# Patient Record
Sex: Female | Born: 1975 | Race: Black or African American | Hispanic: No | Marital: Single | Smoking: Never smoker
Health system: Southern US, Community
[De-identification: ages and names within clinical notes are randomized; demographics above are authoritative.]

## PROBLEM LIST (undated history)

## (undated) DIAGNOSIS — E282 Polycystic ovarian syndrome: Secondary | ICD-10-CM

## (undated) DIAGNOSIS — F419 Anxiety disorder, unspecified: Secondary | ICD-10-CM

## (undated) DIAGNOSIS — E559 Vitamin D deficiency, unspecified: Secondary | ICD-10-CM

## (undated) DIAGNOSIS — I10 Essential (primary) hypertension: Secondary | ICD-10-CM

## (undated) DIAGNOSIS — R0600 Dyspnea, unspecified: Secondary | ICD-10-CM

## (undated) DIAGNOSIS — R5382 Chronic fatigue, unspecified: Secondary | ICD-10-CM

## (undated) DIAGNOSIS — K219 Gastro-esophageal reflux disease without esophagitis: Secondary | ICD-10-CM

## (undated) DIAGNOSIS — E785 Hyperlipidemia, unspecified: Secondary | ICD-10-CM

## (undated) DIAGNOSIS — N926 Irregular menstruation, unspecified: Secondary | ICD-10-CM

## (undated) DIAGNOSIS — F32A Depression, unspecified: Secondary | ICD-10-CM

## (undated) DIAGNOSIS — M2550 Pain in unspecified joint: Secondary | ICD-10-CM

## (undated) DIAGNOSIS — G4730 Sleep apnea, unspecified: Secondary | ICD-10-CM

## (undated) DIAGNOSIS — L709 Acne, unspecified: Secondary | ICD-10-CM

## (undated) DIAGNOSIS — E739 Lactose intolerance, unspecified: Secondary | ICD-10-CM

## (undated) DIAGNOSIS — R600 Localized edema: Secondary | ICD-10-CM

## (undated) DIAGNOSIS — G9332 Myalgic encephalomyelitis/chronic fatigue syndrome: Secondary | ICD-10-CM

## (undated) DIAGNOSIS — F329 Major depressive disorder, single episode, unspecified: Secondary | ICD-10-CM

## (undated) DIAGNOSIS — N979 Female infertility, unspecified: Secondary | ICD-10-CM

## (undated) DIAGNOSIS — K5900 Constipation, unspecified: Secondary | ICD-10-CM

## (undated) DIAGNOSIS — M549 Dorsalgia, unspecified: Secondary | ICD-10-CM

## (undated) DIAGNOSIS — E669 Obesity, unspecified: Secondary | ICD-10-CM

## (undated) HISTORY — DX: Myalgic encephalomyelitis/chronic fatigue syndrome: G93.32

## (undated) HISTORY — DX: Obesity, unspecified: E66.9

## (undated) HISTORY — DX: Constipation, unspecified: K59.00

## (undated) HISTORY — DX: Female infertility, unspecified: N97.9

## (undated) HISTORY — DX: Chronic fatigue, unspecified: R53.82

## (undated) HISTORY — DX: Dyspnea, unspecified: R06.00

## (undated) HISTORY — DX: Irregular menstruation, unspecified: N92.6

## (undated) HISTORY — DX: Vitamin D deficiency, unspecified: E55.9

## (undated) HISTORY — DX: Depression, unspecified: F32.A

## (undated) HISTORY — DX: Dorsalgia, unspecified: M54.9

## (undated) HISTORY — DX: Polycystic ovarian syndrome: E28.2

## (undated) HISTORY — PX: EYE SURGERY: SHX253

## (undated) HISTORY — DX: Gastro-esophageal reflux disease without esophagitis: K21.9

## (undated) HISTORY — DX: Localized edema: R60.0

## (undated) HISTORY — PX: BLADDER SURGERY: SHX569

## (undated) HISTORY — DX: Essential (primary) hypertension: I10

## (undated) HISTORY — DX: Acne, unspecified: L70.9

## (undated) HISTORY — DX: Pain in unspecified joint: M25.50

## (undated) HISTORY — DX: Hyperlipidemia, unspecified: E78.5

## (undated) HISTORY — DX: Lactose intolerance, unspecified: E73.9

## (undated) HISTORY — DX: Sleep apnea, unspecified: G47.30

## (undated) HISTORY — DX: Anxiety disorder, unspecified: F41.9

---
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
## 1998-01-22 ENCOUNTER — Encounter: Admission: RE | Admit: 1998-01-22 | Discharge: 1998-04-22 | Payer: Self-pay | Admitting: *Deleted

## 1998-02-17 ENCOUNTER — Encounter: Payer: Self-pay | Admitting: Emergency Medicine

## 1998-02-17 ENCOUNTER — Emergency Department (HOSPITAL_COMMUNITY): Admission: EM | Admit: 1998-02-17 | Discharge: 1998-02-17 | Payer: Self-pay | Admitting: Emergency Medicine

## 1998-12-09 ENCOUNTER — Emergency Department (HOSPITAL_COMMUNITY): Admission: EM | Admit: 1998-12-09 | Discharge: 1998-12-09 | Payer: Self-pay | Admitting: Emergency Medicine

## 1999-05-14 ENCOUNTER — Other Ambulatory Visit: Admission: RE | Admit: 1999-05-14 | Discharge: 1999-05-14 | Payer: Self-pay | Admitting: Gynecology

## 2000-04-20 ENCOUNTER — Encounter (INDEPENDENT_AMBULATORY_CARE_PROVIDER_SITE_OTHER): Payer: Self-pay | Admitting: *Deleted

## 2000-04-20 ENCOUNTER — Ambulatory Visit (HOSPITAL_BASED_OUTPATIENT_CLINIC_OR_DEPARTMENT_OTHER): Admission: RE | Admit: 2000-04-20 | Discharge: 2000-04-20 | Payer: Self-pay | Admitting: Urology

## 2000-06-03 ENCOUNTER — Other Ambulatory Visit: Admission: RE | Admit: 2000-06-03 | Discharge: 2000-06-03 | Payer: Self-pay | Admitting: Gynecology

## 2002-10-10 ENCOUNTER — Other Ambulatory Visit: Admission: RE | Admit: 2002-10-10 | Discharge: 2002-10-10 | Payer: Self-pay | Admitting: Gynecology

## 2003-09-16 ENCOUNTER — Emergency Department (HOSPITAL_COMMUNITY): Admission: EM | Admit: 2003-09-16 | Discharge: 2003-09-16 | Payer: Self-pay | Admitting: *Deleted

## 2003-10-12 ENCOUNTER — Other Ambulatory Visit: Admission: RE | Admit: 2003-10-12 | Discharge: 2003-10-12 | Payer: Self-pay | Admitting: Gynecology

## 2004-12-05 ENCOUNTER — Emergency Department (HOSPITAL_COMMUNITY): Admission: EM | Admit: 2004-12-05 | Discharge: 2004-12-05 | Payer: Self-pay | Admitting: Family Medicine

## 2005-01-31 ENCOUNTER — Emergency Department (HOSPITAL_COMMUNITY): Admission: EM | Admit: 2005-01-31 | Discharge: 2005-01-31 | Payer: Self-pay | Admitting: Emergency Medicine

## 2005-08-14 ENCOUNTER — Other Ambulatory Visit: Admission: RE | Admit: 2005-08-14 | Discharge: 2005-08-14 | Payer: Self-pay | Admitting: Gynecology

## 2007-01-11 ENCOUNTER — Emergency Department (HOSPITAL_COMMUNITY): Admission: EM | Admit: 2007-01-11 | Discharge: 2007-01-11 | Payer: Self-pay | Admitting: Emergency Medicine

## 2007-05-17 ENCOUNTER — Other Ambulatory Visit: Admission: RE | Admit: 2007-05-17 | Discharge: 2007-05-17 | Payer: Self-pay | Admitting: Gynecology

## 2007-11-21 ENCOUNTER — Emergency Department (HOSPITAL_COMMUNITY): Admission: EM | Admit: 2007-11-21 | Discharge: 2007-11-21 | Payer: Self-pay | Admitting: *Deleted

## 2007-12-18 ENCOUNTER — Emergency Department (HOSPITAL_BASED_OUTPATIENT_CLINIC_OR_DEPARTMENT_OTHER): Admission: EM | Admit: 2007-12-18 | Discharge: 2007-12-19 | Payer: Self-pay | Admitting: Emergency Medicine

## 2009-04-20 ENCOUNTER — Emergency Department (HOSPITAL_BASED_OUTPATIENT_CLINIC_OR_DEPARTMENT_OTHER): Admission: EM | Admit: 2009-04-20 | Discharge: 2009-04-20 | Payer: Self-pay | Admitting: Emergency Medicine

## 2009-05-03 ENCOUNTER — Ambulatory Visit (HOSPITAL_COMMUNITY): Admission: RE | Admit: 2009-05-03 | Discharge: 2009-05-03 | Payer: Self-pay | Admitting: Internal Medicine

## 2009-07-16 ENCOUNTER — Ambulatory Visit: Payer: Self-pay | Admitting: Internal Medicine

## 2009-07-23 LAB — CBC WITH DIFFERENTIAL/PLATELET
Basophils Absolute: 0 10*3/uL (ref 0.0–0.1)
EOS%: 2.7 % (ref 0.0–7.0)
Eosinophils Absolute: 0.2 10*3/uL (ref 0.0–0.5)
HCT: 36.9 % (ref 34.8–46.6)
HGB: 12.6 g/dL (ref 11.6–15.9)
LYMPH%: 27.5 % (ref 14.0–49.7)
MCH: 32.1 pg (ref 25.1–34.0)
MCHC: 34.1 g/dL (ref 31.5–36.0)
MCV: 94.3 fL (ref 79.5–101.0)
MONO#: 0.5 10*3/uL (ref 0.1–0.9)
MONO%: 6.9 % (ref 0.0–14.0)
NEUT%: 62.4 % (ref 38.4–76.8)
Platelets: 284 10*3/uL (ref 145–400)
lymph#: 1.9 10*3/uL (ref 0.9–3.3)

## 2009-07-29 LAB — COMPREHENSIVE METABOLIC PANEL
ALT: 13 U/L (ref 0–35)
AST: 17 U/L (ref 0–37)
Albumin: 4.2 g/dL (ref 3.5–5.2)
CO2: 24 mEq/L (ref 19–32)
Calcium: 9.2 mg/dL (ref 8.4–10.5)
Chloride: 102 mEq/L (ref 96–112)
Total Protein: 7.3 g/dL (ref 6.0–8.3)

## 2009-07-29 LAB — HYPERCOAGULABLE PANEL, COMPREHENSIVE
AntiThromb III Func: 104 % (ref 76–126)
Anticardiolipin IgA: 2 APL U/mL (ref <22)
Anticardiolipin IgG: 3 GPL U/mL (ref <23)
Beta-2 Glyco I IgG: 0 G Units (ref <20)
Beta-2-Glycoprotein I IgA: 3 A Units (ref <20)
Lupus Anticoagulant: NOT DETECTED

## 2010-07-11 NOTE — Op Note (Signed)
[facility]  Patient:    Tina Hartman, Tina Hartman                           MRN: [id number] Proc. Date: 04/20/00 Adm. Date:  [id number] Attending:  Nelma Rothman Iii                           Operative Report  PREOPERATIVE DIAGNOSIS:  Periurethral lesions x 2.  POSTOPERATIVE DIAGNOSIS:  Periurethral lesions x 2.  OPERATION PERFORMED:  Excision of periurethral lesions.  SURGEON:  Lucrezia Starch. Ovidio Hanger, M.D.  ANESTHESIA:  General laryngeal airway.  ESTIMATED BLOOD LOSS:  5 cc.  DRAINS:  16 French Foley catheter.  COMPLICATIONS:  None.  INDICATIONS FOR PROCEDURE:  The patient is a lovely 35 year old black female who was seen by Dimas Aguas C. Mezer, M.D. and felt to have a periurethral mass. She was seen by office and underwent cystourethroscopy and was noted to have two lesions laterally on the right and left side from the urethral meatus which were polypoid lesions and certainly needed excision for biopsy purposes. She understands risks, benefits and alternatives and has elected to proceed.  DESCRIPTION OF PROCEDURE:  The patient was placed in supine position.  After proper general laryngeal airway anesthesia, she was prepped and draped with Betadine in sterile fashion and placed in dorsal lithotomy position.  A speculum was placed and a 16 French Foley catheter was placed into the bladder and the bladder was drained.  Two polypoid lesions were noted to be from the urethral meatus, one at 3 oclock and one at 9 oclock.  The 3 oclock  was slightly larger and was approximately 0.5 cm in diameter.  The base of each was injected with approximately 1 cc of 1% Xylocaine with epinephrine solution with a 25 gauge needle and each was excised with tenotomy scissors.  The mucosa was then run on each side with a running 4-0 chromic catgut locked stitch to facilitate hemostatic coaptation.  Again, hemostasis was noted to be present.  The Foley catheter was maintained in  place and the patient was taken to the recovery room stable.  The specimens were submitted to pathology. DD:  04/20/00 TD:  04/20/00 Job: [id number]

## 2010-11-25 LAB — CBC
Hemoglobin: 14
MCHC: 33.9
MCV: 93.9
Platelets: 290
RDW: 13.7

## 2010-11-25 LAB — BASIC METABOLIC PANEL
BUN: 12
Creatinine, Ser: 0.7
GFR calc Af Amer: >60

## 2010-11-25 LAB — URINALYSIS, ROUTINE W REFLEX MICROSCOPIC
Bilirubin Urine: NEGATIVE
Glucose, UA: NEGATIVE
Ketones, ur: NEGATIVE
Nitrite: NEGATIVE
Protein, ur: NEGATIVE
Specific Gravity, Urine: 1.021

## 2010-11-25 LAB — URINE CULTURE: Colony Count: NO GROWTH

## 2010-11-25 LAB — GLUCOSE, CAPILLARY: Glucose-Capillary: 132 — ABNORMAL HIGH

## 2010-11-25 LAB — PROTIME-INR
INR: 1
Prothrombin Time: 13.4

## 2010-11-25 LAB — DIFFERENTIAL
Lymphocytes Relative: 35
Lymphs Abs: 2.2
Neutro Abs: 3.4

## 2010-11-25 LAB — PREGNANCY, URINE: Preg Test, Ur: NEGATIVE

## 2010-11-25 LAB — APTT: aPTT: 30

## 2015-04-12 ENCOUNTER — Emergency Department (HOSPITAL_COMMUNITY)
Admission: EM | Admit: 2015-04-12 | Discharge: 2015-04-13 | Disposition: A | Discharge status: Home / Self Care | Payer: Commercial Managed Care - PPO | Attending: Emergency Medicine | Admitting: Emergency Medicine

## 2015-04-12 ENCOUNTER — Encounter (HOSPITAL_COMMUNITY): Payer: Self-pay | Admitting: Emergency Medicine

## 2015-04-12 DIAGNOSIS — M25571 Pain in right ankle and joints of right foot: Secondary | ICD-10-CM

## 2015-04-12 DIAGNOSIS — Z79899 Other long term (current) drug therapy: Secondary | ICD-10-CM | POA: Diagnosis not present

## 2015-04-12 DIAGNOSIS — R2241 Localized swelling, mass and lump, right lower limb: Secondary | ICD-10-CM | POA: Diagnosis not present

## 2015-04-12 DIAGNOSIS — Z3202 Encounter for pregnancy test, result negative: Secondary | ICD-10-CM | POA: Insufficient documentation

## 2015-04-12 DIAGNOSIS — Z91040 Latex allergy status: Secondary | ICD-10-CM | POA: Insufficient documentation

## 2015-04-12 DIAGNOSIS — M25471 Effusion, right ankle: Secondary | ICD-10-CM

## 2015-04-12 NOTE — ED Notes (Signed)
Pt states that she started having R calf swelling and pain today. States she travels in the car a lot for work. Denies trauma. Alert and oriented. Denies SOB.

## 2015-04-13 ENCOUNTER — Emergency Department (HOSPITAL_COMMUNITY): Payer: Commercial Managed Care - PPO

## 2015-04-13 LAB — I-STAT BETA HCG BLOOD, ED (MC, WL, AP ONLY): I-stat hCG, quantitative: <5 m[IU]/mL (ref <5)

## 2015-04-13 LAB — D-DIMER, QUANTITATIVE: D-Dimer, Quant: <0.27 ug/mL-FEU (ref 0.00–0.50)

## 2015-04-13 MED ORDER — IBUPROFEN 800 MG PO TABS: 800.0000 mg | ORAL_TABLET | Freq: Three times a day (TID) | ORAL | Status: DC

## 2015-04-13 NOTE — ED Notes (Signed)
Pt states she was taking a vitamin supplement to help with mood and tiredness and she nows remembers that 2 or 3 years ago when she took something she had same feeling but it was because her Vitamin D was low

## 2015-04-13 NOTE — ED Provider Notes (Signed)
CSN: [id number]     Arrival date & time 04/12/15  2226 History   First MD Initiated Contact with Patient 04/13/15 0010     Chief Complaint  Patient presents with  . Leg Pain     (Consider location/radiation/quality/duration/timing/severity/associated sxs/prior Treatment) HPI   Patient is a 40 year old female, otherwise healthy, she presents to emergency room with right medial ankle swelling and discomfort for one week which has gradually been improving with compression stockings and elevation. She states that she first noticed it a week ago when she was driving between patient's, she works as a Water engineer. Her pain is described as a throbbing and soreness that radiates halfway up her medial calf and a few times has radiated up to the back of her right knee with a sensation of tightness in her hamstrings.  She denies any injury to the ankle. She does not have a history of osteoarthritis or any inflammatory joint disease.  She has been able to walk and bear weight without much discomfort.  She began wearing compression stockings approximately 5 days ago, and she has been elevating her legs at night with significant improvement of her swelling. She states that there was some redness when it first began and that has also resolved.  She states that her arrival time at most is 50 minutes. Approximately 2 weeks ago she drove to [place] but she frequently got in and out of the car and walked her dog. She denies any history of DVT/PE. She does not smoke cigarettes, does not take any oral birth control or hormone replacement.  She denies personal CA hx.  No SOP, CP.   No past medical history on file. Past Surgical History  Procedure Laterality Date  . Bladder surgery      polyp removal  . Eye surgery Right    No family history on file. Social History  Substance Use Topics  . Smoking status: Never Smoker   . Smokeless tobacco: Not on file  . Alcohol Use: Yes   OB History    No data  available     Review of Systems  All other systems reviewed and are negative.     Allergies  Latex  Home Medications   Prior to Admission medications   Medication Sig Start Date End Date Taking? Authorizing Provider  Calcium Citrate-Vitamin D (CALCIUM + D PO) Take 1 tablet by mouth daily.   Yes Historical Provider, MD  OVER THE COUNTER MEDICATION Take 1 tablet by mouth daily. Sunny mood vitamin   Yes Historical Provider, MD  spironolactone (ALDACTONE) 100 MG tablet Take 100 mg by mouth daily.   Yes Historical Provider, MD  ibuprofen (ADVIL,MOTRIN) 800 MG tablet Take 1 tablet (800 mg total) by mouth 3 (three) times daily. 04/13/15   Danelle Berry, PA-C   BP 145/104 mmHg  Pulse 76  Temp(Src) 97.8 F (36.6 C) (Oral)  Resp 18  SpO2 100%  LMP 04/01/2015 (Exact Date) Physical Exam  Constitutional: She is oriented to person, place, and time. She appears well-developed and well-nourished. No distress.  HENT:  Head: Normocephalic and atraumatic.  Right Ear: External ear normal.  Left Ear: External ear normal.  Nose: Nose normal.  Mouth/Throat: Oropharynx is clear and moist. No oropharyngeal exudate.  Eyes: Conjunctivae and EOM are normal. Pupils are equal, round, and reactive to light. Right eye exhibits no discharge. Left eye exhibits no discharge. No scleral icterus.  Neck: Normal range of motion. Neck supple. No JVD present. No  tracheal deviation present.  Cardiovascular: Normal rate, regular rhythm and normal heart sounds.  Exam reveals no gallop and no friction rub.   No murmur heard. Pulmonary/Chest: Effort normal and breath sounds normal. No stridor. No respiratory distress. She has no wheezes. She has no rales. She exhibits no tenderness.  Abdominal: Soft. Bowel sounds are normal.  Musculoskeletal: Normal range of motion. She exhibits edema and tenderness.       Right ankle: She exhibits swelling. She exhibits normal range of motion, no ecchymosis, no deformity, no laceration  and normal pulse. No lateral malleolus and no medial malleolus tenderness found. Achilles tendon normal.       Feet:  Lymphadenopathy:    She has no cervical adenopathy.  Neurological: She is alert and oriented to person, place, and time. She exhibits normal muscle tone. Coordination normal.  Skin: Skin is warm and dry. No rash noted. She is not diaphoretic. No erythema. No pallor.  Psychiatric: She has a normal mood and affect. Her behavior is normal. Judgment and thought content normal.  Nursing note and vitals reviewed.   ED Course  Procedures (including critical care time) Labs Review Labs Reviewed  D-DIMER, QUANTITATIVE (NOT AT [facility])  I-STAT BETA HCG BLOOD, ED ([facility], [facility], [facility] ONLY)    Imaging Review Dg Ankle Complete Right  04/13/2015  CLINICAL DATA:  Medial ankle swelling for 1 week, no injury or pain. EXAM: RIGHT ANKLE - COMPLETE 3+ VIEW COMPARISON:  RIGHT ankle radiograph December 05, 2004 FINDINGS: No fracture deformity nor dislocation. The ankle mortise appears congruent and the tibiofibular syndesmosis intact. No destructive bony lesions. Small plantar calcaneal spur. Medial ankle soft tissue swelling without subcutaneous gas or radiopaque foreign bodies. IMPRESSION: Medial ankle soft tissue swelling without acute osseous process. Electronically Signed   By: Awilda Metro M.D.   On: 04/13/2015 01:15   I have personally reviewed and evaluated these images and lab results as part of my medical decision-making.   EKG Interpretation None      MDM   Pt with right medial ankle pain and swelling, no known injury, calves symmetrical, Wells criteria for DVT - 0  D-dimer negative.  Right ankle films pertinent for significant for medial ankle soft tissue swelling without acute osseous process.  The patient was offered an ankle brace but she declined.  She was ambulatory without difficulty and did not need any crutches.  She was discharged in good condition with return precautions  reviewed.  No soft tissue swelling or be treated conservatively with NSAIDS, compression, elevation and ice. She will follow up with her PCP as needed.   Final diagnoses:  Pain and swelling of ankle, right       Danelle Berry, PA-C 04/13/15 0540  April Palumbo, MD 04/13/15 [phone]

## 2015-04-13 NOTE — ED Notes (Signed)
Pt ask that her Vitamin D be checked as well.

## 2015-04-13 NOTE — Discharge Instructions (Signed)
Ankle Pain °Ankle pain is a common symptom. The bones, cartilage, tendons, and muscles of the ankle joint perform a lot of work each day. The ankle joint holds your body weight and allows you to move around. Ankle pain can occur on either side or back of 1 or both ankles. Ankle pain may be sharp and burning or dull and aching. There may be tenderness, stiffness, redness, or warmth around the ankle. The pain occurs more often when a person walks or puts pressure on the ankle. °CAUSES  °There are many reasons ankle pain can develop. It is important to work with your caregiver to identify the cause since many conditions can impact the bones, cartilage, muscles, and tendons. Causes for ankle pain include: °· Injury, including a break (fracture), sprain, or strain often due to a fall, sports, or a high-impact activity. °· Swelling (inflammation) of a tendon (tendonitis). °· Achilles tendon rupture. °· Ankle instability after repeated sprains and strains. °· Poor foot alignment. °· Pressure on a nerve (tarsal tunnel syndrome). °· Arthritis in the ankle or the lining of the ankle. °· Crystal formation in the ankle (gout or pseudogout). °DIAGNOSIS  °A diagnosis is based on your medical history, your symptoms, results of your physical exam, and results of diagnostic tests. Diagnostic tests may include X-ray exams or a computerized magnetic scan (magnetic resonance imaging, MRI). °TREATMENT  °Treatment will depend on the cause of your ankle pain and may include: °· Keeping pressure off the ankle and limiting activities. °· Using crutches or other walking support (a cane or brace). °· Using rest, ice, compression, and elevation. °· Participating in physical therapy or home exercises. °· Wearing shoe inserts or special shoes. °· Losing weight. °· Taking medications to reduce pain or swelling or receiving an injection. °· Undergoing surgery. °HOME CARE INSTRUCTIONS  °· Only take over-the-counter or prescription medicines for  pain, discomfort, or fever as directed by your caregiver. °· Put ice on the injured area. °· Put ice in a plastic bag. °· Place a towel between your skin and the bag. °· Leave the ice on for 15-20 minutes at a time, 03-04 times a day. °· Keep your leg raised (elevated) when possible to lessen swelling. °· Avoid activities that cause ankle pain. °· Follow specific exercises as directed by your caregiver. °· Record how often you have ankle pain, the location of the pain, and what it feels like. This information may be helpful to you and your caregiver. °· Ask your caregiver about returning to work or sports and whether you should drive. °· Follow up with your caregiver for further examination, therapy, or testing as directed. °SEEK MEDICAL CARE IF:  °· Pain or swelling continues or worsens beyond 1 week. °· You have an oral temperature above 102° F (38.9° C). °· You are feeling unwell or have chills. °· You are having an increasingly difficult time with walking. °· You have loss of sensation or other new symptoms. °· You have questions or concerns. °MAKE SURE YOU:  °· Understand these instructions. °· Will watch your condition. °· Will get help right away if you are not doing well or get worse. °  °This information is not intended to replace advice given to you by your health care provider. Make sure you discuss any questions you have with your health care provider. °  °Document Released: 07/30/2009 Document Revised: 05/04/2011 Document Reviewed: 09/11/2014 °Elsevier Interactive Patient Education ©2016 Else[employer] ° °Cryotherapy °Cryotherapy is when you put ice on   your injury. Ice helps lessen pain and puffiness (swelling) after an injury. Ice works the best when you start using it in the first 24 to 48 hours after an injury. HOME CARE  Put a dry or damp towel between the ice pack and your skin.  You may press gently on the ice pack.  Leave the ice on for no more than 10 to 20 minutes at a time.  Check your  skin after 5 minutes to make sure your skin is okay.  Rest at least 20 minutes between ice pack uses.  Stop using ice when your skin loses feeling (numbness).  Do not use ice on someone who cannot tell you when it hurts. This includes small children and people with memory problems (dementia). GET HELP RIGHT AWAY IF:  You have white spots on your skin.  Your skin turns blue or pale.  Your skin feels waxy or hard.  Your puffiness gets worse. MAKE SURE YOU:   Understand these instructions.  Will watch your condition.  Will get help right away if you are not doing well or get worse.   This information is not intended to replace advice given to you by your health care provider. Make sure you discuss any questions you have with your health care provider.   Document Released: 07/29/2007 Document Revised: 05/04/2011 Document Reviewed: 10/02/2010 Elsevier Interactive Patient Education 2016 Else[employer] Inc[employer]  Joint Pain Joint pain, which is also called arthralgia, can be caused by many things. Joint pain often goes away when you follow your health care provider's instructions for relieving pain at home. However, joint pain can also be caused by conditions that require further treatment. Common causes of joint pain include:  Bruising in the area of the joint.  Overuse of the joint.  Wear and tear on the joints that occur with aging (osteoarthritis).  Various other forms of arthritis.  A buildup of a crystal form of uric acid in the joint (gout).  Infections of the joint (septic arthritis) or of the bone (osteomyelitis). Your health care provider may recommend medicine to help with the pain. If your joint pain continues, additional tests may be needed to diagnose your condition. HOME CARE INSTRUCTIONS Watch your condition for any changes. Follow these instructions as directed to lessen the pain that you are feeling.  Take medicines only as directed by your health care  provider.  Rest the affected area for as long as your health care provider says that you should. If directed to do so, raise the painful joint above the level of your heart while you are sitting or lying down.  Do not do things that cause or worsen pain.  If directed, apply ice to the painful area:  Put ice in a plastic bag.  Place a towel between your skin and the bag.  Leave the ice on for 20 minutes, 2-3 times per day.  Wear an elastic bandage, splint, or sling as directed by your health care provider. Loosen the elastic bandage or splint if your fingers or toes become numb and tingle, or if they turn cold and blue.  Begin exercising or stretching the affected area as directed by your health care provider. Ask your health care provider what types of exercise are safe for you.  Keep all follow-up visits as directed by your health care provider. This is important. SEEK MEDICAL CARE IF:  Your pain increases, and medicine does not help.  Your joint pain does not improve within 3 days.  You have increased bruising or swelling.  You have a fever.  You lose 10 lb (4.5 kg) or more without trying. SEEK IMMEDIATE MEDICAL CARE IF:  You are not able to move the joint.  Your fingers or toes become numb or they turn cold and blue.   This information is not intended to replace advice given to you by your health care provider. Make sure you discuss any questions you have with your health care provider.   Document Released: 02/09/2005 Document Revised: 03/02/2014 Document Reviewed: 11/21/2013 Elsevier Interactive Patient Education 2016 Else[employer] Inc.  Ankle Sprain An ankle sprain is an injury to the strong, fibrous tissues (ligaments) that hold the bones of your ankle joint together.  CAUSES An ankle sprain is usually caused by a fall or by twisting your ankle. Ankle sprains most commonly occur when you step on the outer edge of your foot, and your ankle turns inward. People who  participate in sports are more prone to these types of injuries.  SYMPTOMS   Pain in your ankle. The pain may be present at rest or only when you are trying to stand or walk.  Swelling.  Bruising. Bruising may develop immediately or within 1 to 2 days after your injury.  Difficulty standing or walking, particularly when turning corners or changing directions. DIAGNOSIS  Your caregiver will ask you details about your injury and perform a physical exam of your ankle to determine if you have an ankle sprain. During the physical exam, your caregiver will press on and apply pressure to specific areas of your foot and ankle. Your caregiver will try to move your ankle in certain ways. An X-ray exam may be done to be sure a bone was not broken or a ligament did not separate from one of the bones in your ankle (avulsion fracture).  TREATMENT  Certain types of braces can help stabilize your ankle. Your caregiver can make a recommendation for this. Your caregiver may recommend the use of medicine for pain. If your sprain is severe, your caregiver may refer you to a surgeon who helps to restore function to parts of your skeletal system (orthopedist) or a physical therapist. HOME CARE INSTRUCTIONS   Apply ice to your injury for 1-2 days or as directed by your caregiver. Applying ice helps to reduce inflammation and pain.  Put ice in a plastic bag.  Place a towel between your skin and the bag.  Leave the ice on for 15-20 minutes at a time, every 2 hours while you are awake.  Only take over-the-counter or prescription medicines for pain, discomfort, or fever as directed by your caregiver.  Elevate your injured ankle above the level of your heart as much as possible for 2-3 days.  If your caregiver recommends crutches, use them as instructed. Gradually put weight on the affected ankle. Continue to use crutches or a cane until you can walk without feeling pain in your ankle.  If you have a plaster  splint, wear the splint as directed by your caregiver. Do not rest it on anything harder than a pillow for the first 24 hours. Do not put weight on it. Do not get it wet. You may take it off to take a shower or bath.  You may have been given an elastic bandage to wear around your ankle to provide support. If the elastic bandage is too tight (you have numbness or tingling in your foot or your foot becomes cold and blue), adjust the bandage to  make it comfortable.  If you have an air splint, you may blow more air into it or let air out to make it more comfortable. You may take your splint off at night and before taking a shower or bath. Wiggle your toes in the splint several times per day to decrease swelling. SEEK MEDICAL CARE IF:   You have rapidly increasing bruising or swelling.  Your toes feel extremely cold or you lose feeling in your foot.  Your pain is not relieved with medicine. SEEK IMMEDIATE MEDICAL CARE IF:  Your toes are numb or blue.  You have severe pain that is increasing. MAKE SURE YOU:   Understand these instructions.  Will watch your condition.  Will get help right away if you are not doing well or get worse.   This information is not intended to replace advice given to you by your health care provider. Make sure you discuss any questions you have with your health care provider.   Document Released: 02/09/2005 Document Revised: 03/02/2014 Document Reviewed: 02/21/2011 Elsevier Interactive Patient Education 2016 [employer] Inc.   RICE for Routine Care of Injuries Theroutine careofmanyinjuriesincludes rest, ice, compression, and elevation (RICE therapy). RICE therapy is often recommended for injuries to soft tissues, such as a muscle strain, ligament injuries, bruises, and overuse injuries. It can also be used for some bony injuries. Using RICE therapy can help to relieve pain, lessen swelling, and enable your body to heal. Rest Rest is required to allow your body  to heal. This usually involves reducing your normal activities and avoiding use of the injured part of your body. Generally, you can return to your normal activities when you are comfortable and have been given permission by your health care provider. Ice Icing your injury helps to keep the swelling down, and it lessens pain. Do not apply ice directly to your skin.  Put ice in a plastic bag.  Place a towel between your skin and the bag.  Leave the ice on for 20 minutes, 2-3 times a day. Do this for as long as you are directed by your health care provider. Compression Compression means putting pressure on the injured area. Compression helps to keep swelling down, gives support, and helps with discomfort. Compression may be done with an elastic bandage. If an elastic bandage has been applied, follow these general tips:  Remove and reapply the bandage every 3-4 hours or as directed by your health care provider.  Make sure the bandage is not wrapped too tightly, because this can cut off circulation. If part of your body beyond the bandage becomes blue, numb, cold, swollen, or more painful, your bandage is most likely too tight. If this occurs, remove your bandage and reapply it more loosely.  See your health care provider if the bandage seems to be making your problems worse rather than better. Elevation Elevation means keeping the injured area raised. This helps to lessen swelling and decrease pain. If possible, your injured area should be elevated at or above the level of your heart or the center of your chest. WHEN SHOULD I SEEK MEDICAL CARE? You should seek medical care if:  Your pain and swelling continue.  Your symptoms are getting worse rather than improving. These symptoms may indicate that further evaluation or further X-rays are needed. Sometimes, X-rays may not show a small broken bone (fracture) until a number of days later. Make a follow-up appointment with your health care  provider. WHEN SHOULD I SEEK IMMEDIATE MEDICAL CARE? You  should seek immediate medical care if:  You have sudden severe pain at or below the area of your injury.  You have redness or increased swelling around your injury.  You have tingling or numbness at or below the area of your injury that does not improve after you remove the elastic bandage.   This information is not intended to replace advice given to you by your health care provider. Make sure you discuss any questions you have with your health care provider.   Document Released: 05/24/2000 Document Revised: 10/31/2014 Document Reviewed: 01/17/2014 Elsevier Interactive Patient Education [employer]

## 2017-05-11 DIAGNOSIS — Z79899 Other long term (current) drug therapy: Secondary | ICD-10-CM | POA: Diagnosis not present

## 2017-05-11 DIAGNOSIS — L819 Disorder of pigmentation, unspecified: Secondary | ICD-10-CM | POA: Diagnosis not present

## 2017-05-11 DIAGNOSIS — L700 Acne vulgaris: Secondary | ICD-10-CM | POA: Diagnosis not present

## 2017-05-22 IMAGING — CR DG ANKLE COMPLETE 3+V*R*
3 series · 3 of 3 positions shown · non-contrast
Comparison: RIGHT ankle radiograph December 05, 2004

CLINICAL DATA: Medial ankle swelling for 1 week, no injury or pain.

EXAM:
RIGHT ANKLE - COMPLETE 3+ VIEW

[x ankle ap right]
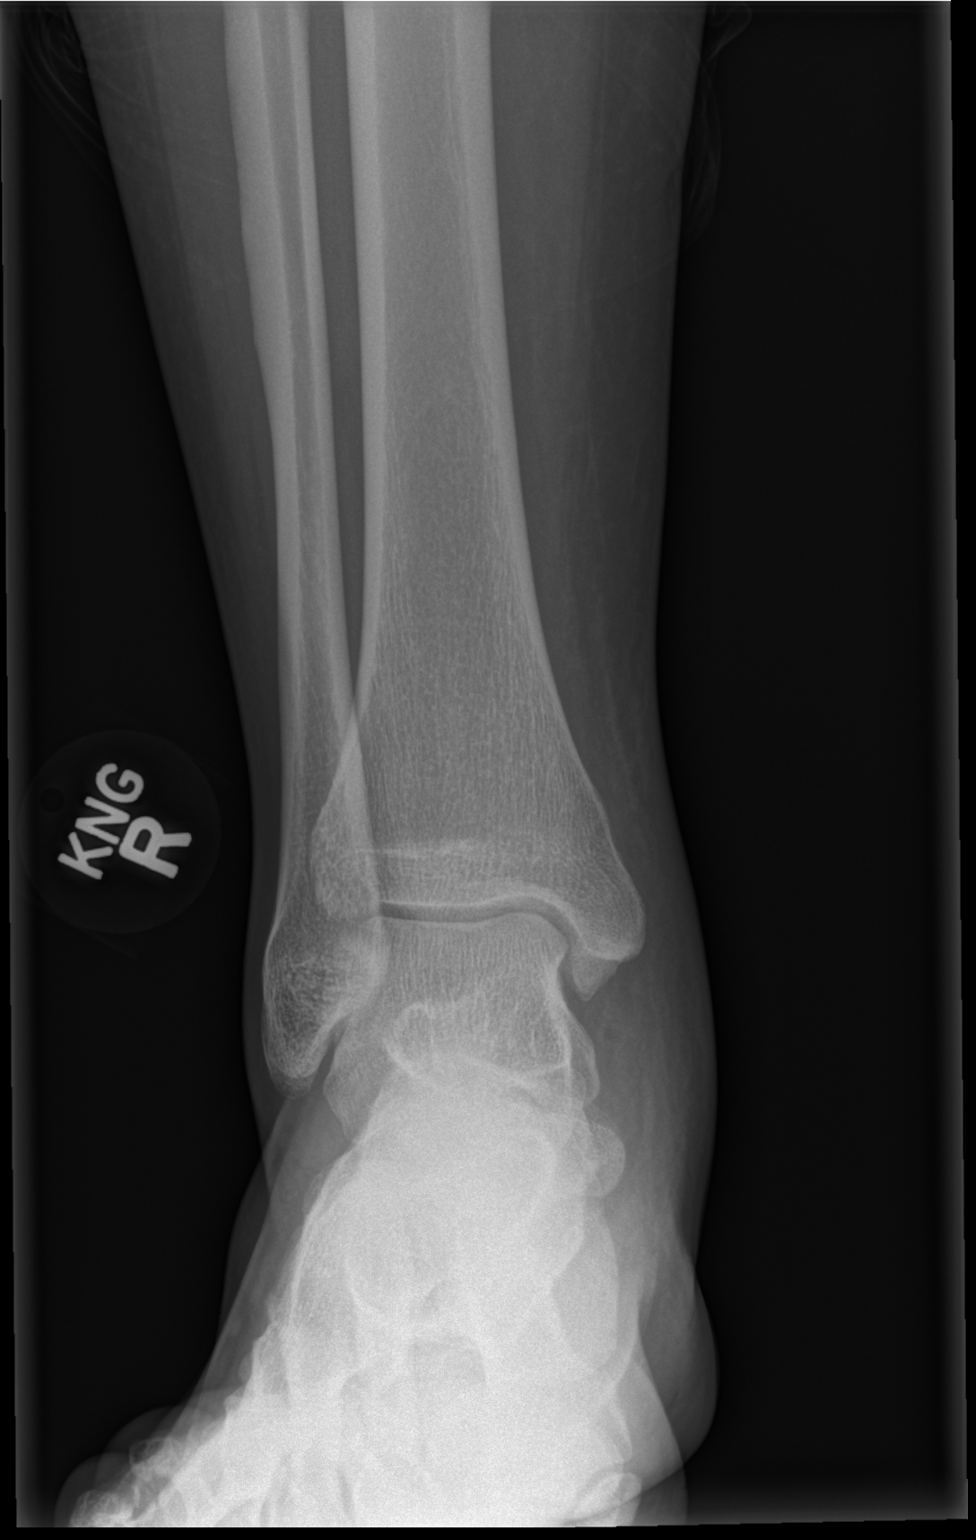

[x ankle obl right]
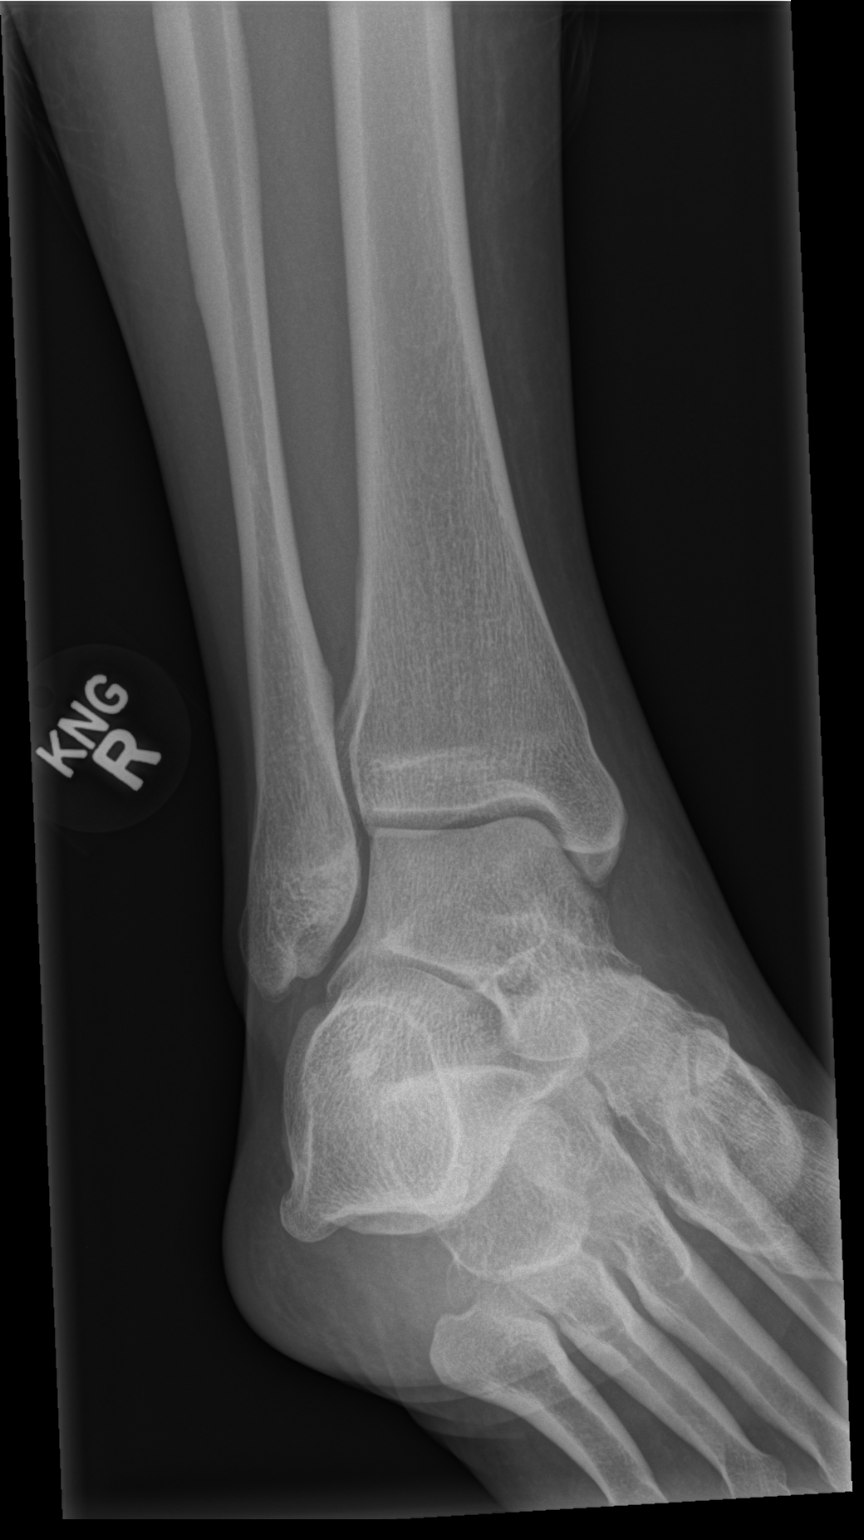

[x ankle lat right]
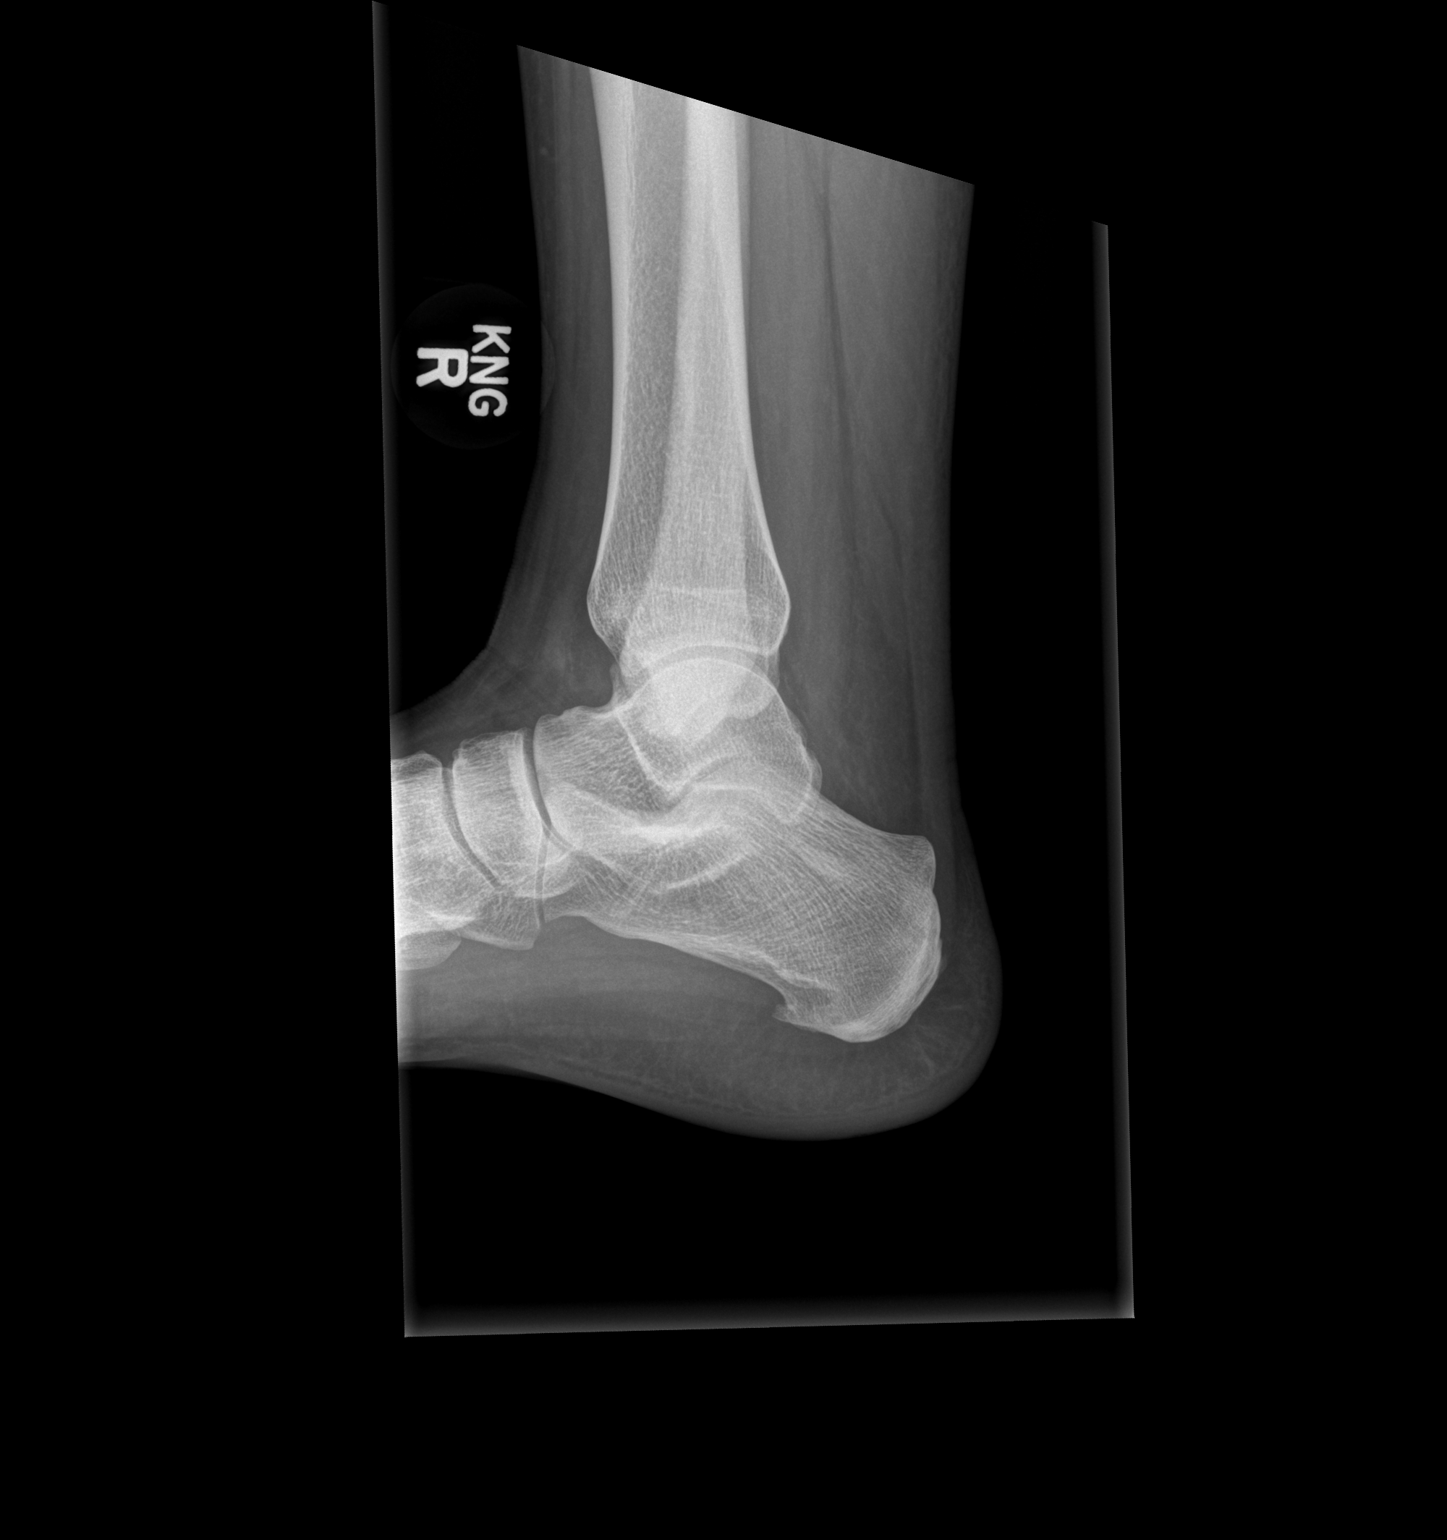

[3 of 3 positions shown; findings below may reference images not displayed]

FINDINGS: No fracture deformity nor dislocation. The ankle mortise appears
congruent and the tibiofibular syndesmosis intact. No destructive
bony lesions. Small plantar calcaneal spur. Medial ankle soft tissue
swelling without subcutaneous gas or radiopaque foreign bodies.
IMPRESSION: Medial ankle soft tissue swelling without acute osseous process.

## 2017-06-10 DIAGNOSIS — M1712 Unilateral primary osteoarthritis, left knee: Secondary | ICD-10-CM | POA: Diagnosis not present

## 2017-06-10 DIAGNOSIS — S838X2D Sprain of other specified parts of left knee, subsequent encounter: Secondary | ICD-10-CM | POA: Diagnosis not present

## 2017-07-15 DIAGNOSIS — M2242 Chondromalacia patellae, left knee: Secondary | ICD-10-CM | POA: Diagnosis not present

## 2017-07-22 DIAGNOSIS — M94262 Chondromalacia, left knee: Secondary | ICD-10-CM | POA: Diagnosis not present

## 2017-07-22 DIAGNOSIS — Z6841 Body Mass Index (BMI) 40.0 and over, adult: Secondary | ICD-10-CM | POA: Diagnosis not present

## 2017-10-12 DIAGNOSIS — L539 Erythematous condition, unspecified: Secondary | ICD-10-CM | POA: Diagnosis not present

## 2017-10-12 DIAGNOSIS — B850 Pediculosis due to Pediculus humanus capitis: Secondary | ICD-10-CM | POA: Diagnosis not present

## 2017-10-19 DIAGNOSIS — Z0000 Encounter for general adult medical examination without abnormal findings: Secondary | ICD-10-CM | POA: Diagnosis not present

## 2017-10-19 DIAGNOSIS — Z13220 Encounter for screening for lipoid disorders: Secondary | ICD-10-CM | POA: Diagnosis not present

## 2017-10-19 DIAGNOSIS — E559 Vitamin D deficiency, unspecified: Secondary | ICD-10-CM | POA: Diagnosis not present

## 2017-10-19 DIAGNOSIS — E282 Polycystic ovarian syndrome: Secondary | ICD-10-CM | POA: Diagnosis not present

## 2017-11-09 DIAGNOSIS — L700 Acne vulgaris: Secondary | ICD-10-CM | POA: Diagnosis not present

## 2017-11-09 DIAGNOSIS — Z79899 Other long term (current) drug therapy: Secondary | ICD-10-CM | POA: Diagnosis not present

## 2017-11-09 DIAGNOSIS — L819 Disorder of pigmentation, unspecified: Secondary | ICD-10-CM | POA: Diagnosis not present

## 2017-12-21 DIAGNOSIS — Z79899 Other long term (current) drug therapy: Secondary | ICD-10-CM | POA: Diagnosis not present

## 2017-12-21 DIAGNOSIS — L810 Postinflammatory hyperpigmentation: Secondary | ICD-10-CM | POA: Diagnosis not present

## 2017-12-21 DIAGNOSIS — L700 Acne vulgaris: Secondary | ICD-10-CM | POA: Diagnosis not present

## 2018-03-31 DIAGNOSIS — L509 Urticaria, unspecified: Secondary | ICD-10-CM | POA: Diagnosis not present

## 2018-03-31 DIAGNOSIS — R110 Nausea: Secondary | ICD-10-CM | POA: Diagnosis not present

## 2018-03-31 DIAGNOSIS — F411 Generalized anxiety disorder: Secondary | ICD-10-CM | POA: Diagnosis not present

## 2018-05-02 DIAGNOSIS — F411 Generalized anxiety disorder: Secondary | ICD-10-CM | POA: Diagnosis not present

## 2018-06-28 DIAGNOSIS — Z124 Encounter for screening for malignant neoplasm of cervix: Secondary | ICD-10-CM | POA: Diagnosis not present

## 2018-06-28 DIAGNOSIS — Z1151 Encounter for screening for human papillomavirus (HPV): Secondary | ICD-10-CM | POA: Diagnosis not present

## 2018-06-28 DIAGNOSIS — Z01419 Encounter for gynecological examination (general) (routine) without abnormal findings: Secondary | ICD-10-CM | POA: Diagnosis not present

## 2018-06-28 DIAGNOSIS — Z113 Encounter for screening for infections with a predominantly sexual mode of transmission: Secondary | ICD-10-CM | POA: Diagnosis not present

## 2018-06-28 DIAGNOSIS — Z6841 Body Mass Index (BMI) 40.0 and over, adult: Secondary | ICD-10-CM | POA: Diagnosis not present

## 2018-07-12 DIAGNOSIS — Z1329 Encounter for screening for other suspected endocrine disorder: Secondary | ICD-10-CM | POA: Diagnosis not present

## 2018-07-12 DIAGNOSIS — N939 Abnormal uterine and vaginal bleeding, unspecified: Secondary | ICD-10-CM | POA: Diagnosis not present

## 2018-07-19 DIAGNOSIS — Z1231 Encounter for screening mammogram for malignant neoplasm of breast: Secondary | ICD-10-CM | POA: Diagnosis not present

## 2018-07-22 DIAGNOSIS — R7989 Other specified abnormal findings of blood chemistry: Secondary | ICD-10-CM | POA: Diagnosis not present

## 2018-07-29 DIAGNOSIS — Z3202 Encounter for pregnancy test, result negative: Secondary | ICD-10-CM | POA: Diagnosis not present

## 2018-07-29 DIAGNOSIS — Z3141 Encounter for fertility testing: Secondary | ICD-10-CM | POA: Diagnosis not present

## 2018-08-02 ENCOUNTER — Other Ambulatory Visit: Payer: Self-pay

## 2018-08-02 ENCOUNTER — Ambulatory Visit (INDEPENDENT_AMBULATORY_CARE_PROVIDER_SITE_OTHER): Payer: Self-pay | Admitting: Family Medicine

## 2018-08-02 ENCOUNTER — Encounter (INDEPENDENT_AMBULATORY_CARE_PROVIDER_SITE_OTHER): Payer: Self-pay | Admitting: Family Medicine

## 2018-08-02 ENCOUNTER — Ambulatory Visit (INDEPENDENT_AMBULATORY_CARE_PROVIDER_SITE_OTHER): Payer: BC Managed Care – PPO | Admitting: Family Medicine

## 2018-08-02 VITALS — BP 109/76 | HR 71 | Temp 98.1°F | Ht 68.0 in | Wt 279.0 lb

## 2018-08-02 DIAGNOSIS — R5383 Other fatigue: Secondary | ICD-10-CM

## 2018-08-02 DIAGNOSIS — Z1331 Encounter for screening for depression: Secondary | ICD-10-CM | POA: Diagnosis not present

## 2018-08-02 DIAGNOSIS — E559 Vitamin D deficiency, unspecified: Secondary | ICD-10-CM | POA: Diagnosis not present

## 2018-08-02 DIAGNOSIS — Z0289 Encounter for other administrative examinations: Secondary | ICD-10-CM

## 2018-08-02 DIAGNOSIS — Z9189 Other specified personal risk factors, not elsewhere classified: Secondary | ICD-10-CM

## 2018-08-02 DIAGNOSIS — R739 Hyperglycemia, unspecified: Secondary | ICD-10-CM | POA: Diagnosis not present

## 2018-08-02 DIAGNOSIS — R0602 Shortness of breath: Secondary | ICD-10-CM | POA: Diagnosis not present

## 2018-08-02 DIAGNOSIS — E7849 Other hyperlipidemia: Secondary | ICD-10-CM

## 2018-08-02 DIAGNOSIS — Z6841 Body Mass Index (BMI) 40.0 and over, adult: Secondary | ICD-10-CM

## 2018-08-02 NOTE — Progress Notes (Signed)
Office: [phone]  /  Fax: [phone]   Dear Dr. Servando Salina,   Thank you for referring Shanequa Whitenight to our clinic. The following note includes my evaluation and treatment recommendations.  HPI:   Chief Complaint: OBESITY    Tina Hartman has been referred by Servando Salina, MD for consultation regarding her obesity and obesity related comorbidities.    Tina Hartman (MR# [id number]) is a 43 y.o. female who presents on 08/02/2018 for obesity evaluation and treatment. Current BMI is Body mass index is 42.42 kg/m. Tina Hartman has been struggling with her weight for many years and has been unsuccessful in either losing weight, maintaining weight loss, or reaching her healthy weight goal.     Tina Hartman attended our information session and states she is currently in the action stage of change and ready to dedicate time achieving and maintaining a healthier weight. Tina Hartman is interested in becoming our patient and working on intensive lifestyle modifications including (but not limited to) diet, exercise, and weight loss.  Tina Hartman is attempting to lose weight to improve fertility.    Tina Hartman states her family eats meals together she thinks her family will eat healthier with her her desired weight loss is 118 lbs she has been heavy most of her life she started gaining weight at age 30 her heaviest weight ever was 278 lbs she is a picky eater and doesn't like to eat healthier foods  she has significant food cravings issues  she snacks frequently in the evenings she skips breakfast and lunch frequently she has considered trying to eat vegetarian she is frequently drinking liquids with calories she frequently makes poor food choices she has binge eating behaviors she struggles with emotional eating    Tina Hartman feels her energy is lower than it should be. This has worsened with weight gain and has not worsened recently. Tina Hartman admits to daytime somnolence and admits to waking up still tired.  Patient is at risk for obstructive sleep apnea. Patient has a history of symptoms of daytime Tina. Patient generally gets 5 or 6 hours of sleep per night, and states they generally have generally restful sleep. Snoring is present. Apneic episodes are present. Epworth Sleepiness Score is 5.  Dyspnea on exertion Tina Hartman notes increasing shortness of breath with exercising and seems to be worsening over time with weight gain. She notes getting out of breath sooner with activity than she used to. This has not gotten worse recently.  Vitamin D deficiency Tina Hartman has a diagnosis of vitamin D deficiency. She is currently taking a multivitamin and admits Tina. She does not have recent lab results.  Hyperlipidemia Tina Hartman has hyperlipidemia and has been attempting to diet control to improve her cholesterol levels with intensive lifestyle modification including a low saturated fat diet, exercise, and weight loss. She is not on a statin and denies any chest pain.  Hyperglycemia Tina Hartman has a history of some elevated blood glucose readings without a diagnosis of diabetes. She has a history of polycystic ovarian syndrome and is on spironalactone. She admits to polyphagia. Tina Hartman does not have recent labs in [place].  At risk for diabetes Tina Hartman is at higher than average risk for developing diabetes due to her hyperglycemia and obesity.  Depression Screen Tina Hartman Food and Mood (modified PHQ-9) score was 15. Depression screen PHQ 2/9 08/02/2018  Decreased Interest 3  Down, Depressed, Hopeless 2  PHQ - 2 Score 5  Altered sleeping 2  Tired, decreased energy 2  Change in appetite 2  Feeling bad  or failure about yourself  2  Trouble concentrating 2  Moving slowly or fidgety/restless 0  Suicidal thoughts 0  PHQ-9 Score 15  Difficult doing work/chores Somewhat difficult   ASSESSMENT AND PLAN:  Other Tina - Plan: EKG 12-Lead, Vitamin B12, CBC With Differential, Folate, T3, T4, free, TSH  Shortness of breath  on exertion  Vitamin D deficiency - Plan: Vitamin B12, VITAMIN D 25 Hydroxy (Vit-D Deficiency, Fractures)  Other hyperlipidemia - Plan: Lipid Panel With LDL/HDL Ratio  Hyperglycemia - Plan: Comprehensive metabolic panel, Hemoglobin A1c, Insulin, random  Depression screening  At risk for diabetes mellitus  Class 3 severe obesity with serious comorbidity and body mass index (BMI) of 40.0 to 44.9 in adult, unspecified obesity type (HCC)  PLAN:  Tina Tina Hartman was informed that her Tina may be related to obesity, depression or many other causes. Labs will be ordered, and in the meanwhile Tina Hartman has agreed to work on diet, exercise and weight loss to help with Tina. Proper sleep hygiene was discussed including the need for 7-8 hours of quality sleep each night. A sleep study was not ordered based on symptoms and Epworth score. An EKG and an indirect calorimetry was ordered today. Tina Hartman agrees to follow up in 2 weeks.  Dyspnea on exertion Tina Hartman's shortness of breath appears to be obesity related and exercise induced. She has agreed to work on weight loss and gradually increase exercise to treat her exercise induced shortness of breath. If Tina Hartman follows our instructions and loses weight without improvement of her shortness of breath, we will plan to refer to pulmonology. We will monitor this condition regularly. Tina Hartman agrees to this plan.  Vitamin D Deficiency Tina Hartman was informed that low vitamin D levels contributes to Tina and are associated with obesity, breast, and colon cancer. Labs will be ordered today and she agrees to follow up in 2 weeks.  Hyperlipidemia Tina Hartman was informed of the American Heart Association Guidelines emphasizing intensive lifestyle modifications as the first line treatment for hyperlipidemia. We discussed many lifestyle modifications today in depth, and Tina Hartman will continue to work on decreasing saturated fats such as fatty red meat, butter and many fried foods. She  will also increase vegetables and lean protein in her diet and continue to work on exercise and weight loss efforts. Labs were ordered today. [name] agrees to start her diet prescription and to follow up at the agreed upon time in 2 weeks.  Hyperglycemia Fasting labs will be obtained and results with be discussed with Tina Hartman in 2 weeks at her follow up visit. In the meanwhile, Ayannah was started on a lower simple carbohydrate diet prescription and will work on weight loss efforts.  Diabetes risk counseling Akeia was given extended (30 minutes) diabetes prevention counseling today. She is 43 y.o. female and has risk factors for diabetes including hyperglycemia and obesity. We discussed intensive lifestyle modifications today with an emphasis on weight loss as well as increasing exercise and decreasing simple carbohydrates in her diet.  Depression Screen Kayliah had a strongly positive depression screening. Depression is commonly associated with obesity and often results in emotional eating behaviors. We will monitor this closely and work on CBT to help improve the non-hunger eating patterns. Referral to Psychology may be required if no improvement is seen as she continues in our clinic.  Obesity Tequita is currently in the action stage of change and her goal is to continue with weight loss efforts. I recommend Ayane begin the structured treatment plan as follows:  She has agreed to follow the Category 3 plan and substitute string cheese for milk.  Olive has been instructed to eventually work up to a goal of 150 minutes of combined cardio and strengthening exercise per week for weight loss and overall health benefits. We discussed the following Behavioral Modification Strategies today: increasing lean protein intake, decreasing simple carbohydrates, no skipping meals, and decrease liquid calories.   She was informed of the importance of frequent follow up visits to maximize her success with intensive  lifestyle modifications for her multiple health conditions. She was informed we would discuss her lab results at her next visit unless there is a critical issue that needs to be addressed sooner. Serine agreed to keep her next visit at the agreed upon time to discuss these results.  ALLERGIES: Allergies  Allergen Reactions  . Latex     MEDICATIONS: Current Outpatient Medications on File Prior to Visit  Medication Sig Dispense Refill  . OVER THE COUNTER MEDICATION Take 1 tablet by mouth daily. Sunny mood vitamin    . spironolactone (ALDACTONE) 100 MG tablet Take 100 mg by mouth daily.     No current facility-administered medications on file prior to visit.     PAST MEDICAL HISTORY: Past Medical History:  Diagnosis Date  . Acne   . Anxiety   . Back pain   . CFS (chronic Tina syndrome)   . Constipation   . Depression   . Dyspnea   . GERD (gastroesophageal reflux disease)   . HLD (hyperlipidemia)   . HTN (hypertension)   . Infertility, female   . Irregular menstruation   . Joint pain   . Lactose intolerance   . Lower extremity edema   . Obesity   . PCOS (polycystic ovarian syndrome)   . Sleep apnea   . Vitamin D deficiency     PAST SURGICAL HISTORY: Past Surgical History:  Procedure Laterality Date  . BLADDER SURGERY     polyp removal  . EYE SURGERY Right     SOCIAL HISTORY: Social History   Tobacco Use  . Smoking status: Never Smoker  . Smokeless tobacco: Never Used  Substance Use Topics  . Alcohol use: Yes  . Drug use: No    FAMILY HISTORY: Family History  Problem Relation Age of Onset  . Anxiety disorder Mother   . Sleep apnea Mother   . Hyperlipidemia Father     ROS: Review of Systems  Constitutional: Positive for malaise/Tina.  HENT:       Positive for hay fever.  Eyes:       Wears glasses or contacts.  Respiratory: Shortness of breath:  with activity.        Positive for sudden awakening from sleep with shortness of breath.   Cardiovascular: Negative for chest pain.  Gastrointestinal: Positive for constipation and heartburn.  Musculoskeletal: Positive for back pain, joint pain and myalgias.       Positive for neck and muscle stiffness.  Skin:       Positive for hair or nail changes. Positive for dryness.  Neurological: Positive for weakness.  Endo/Heme/Allergies:       Positive for polyphagia. Positive for hyperglycemia.  Psychiatric/Behavioral: Positive for depression. The patient has insomnia.        Positive for stress.    PHYSICAL EXAM: Blood pressure 109/76, pulse 71, temperature 98.1 F (36.7 C), temperature source Oral, height _0  (1.727 m), weight 279 lb (126.6 kg), last menstrual period 07/26/2018, SpO2 99 %. Body mass  index is 42.42 kg/m. Physical Exam Vitals signs reviewed.  Constitutional:      Appearance: Normal appearance. She is obese.  HENT:     Head: Normocephalic and atraumatic.     Nose: Nose normal.  Eyes:     General: No scleral icterus.    Extraocular Movements: Extraocular movements intact.  Neck:     Musculoskeletal: Normal range of motion and neck supple.     Thyroid: No thyromegaly.     Comments: Negative for thyromegaly. Cardiovascular:     Rate and Rhythm: Normal rate and regular rhythm.  Pulmonary:     Effort: Pulmonary effort is normal. No respiratory distress.  Abdominal:     Palpations: Abdomen is soft.     Tenderness: There is no abdominal tenderness.     Comments: Positive for obesity.  Musculoskeletal:     Comments: ROM normal in all extremities.  Skin:    General: Skin is warm and dry.  Neurological:     Mental Status: She is alert and oriented to person, place, and time.     Coordination: Coordination normal.  Psychiatric:        Mood and Affect: Mood normal.        Behavior: Behavior normal.     RECENT LABS AND TESTS: BMET    Component Value Date/Time   NA 139 07/23/2009 1338   K 3.8 07/23/2009 1338   CL 102 07/23/2009 1338   CO2 24  07/23/2009 1338   GLUCOSE 108 (H) 07/23/2009 1338   BUN 9 07/23/2009 1338   CREATININE 0.84 07/23/2009 1338   CALCIUM 9.2 07/23/2009 1338   GFRNONAA >60 12/19/2007 0013   GFRAA  12/19/2007 0013    >60        The eGFR has been calculated using the MDRD equation. This calculation has not been validated in all clinical   No results found for: HGBA1C No results found for: INSULIN CBC    Component Value Date/Time   WBC 7.0 07/23/2009 1338   WBC 6.3 12/19/2007 0013   RBC 3.91 07/23/2009 1338   RBC 4.40 12/19/2007 0013   HGB 12.6 07/23/2009 1338   HCT 36.9 07/23/2009 1338   PLT 284 07/23/2009 1338   MCV 94.3 07/23/2009 1338   MCH 32.1 07/23/2009 1338   MCHC 34.1 07/23/2009 1338   MCHC 33.9 12/19/2007 0013   RDW 15.0 (H) 07/23/2009 1338   LYMPHSABS 1.9 07/23/2009 1338   MONOABS 0.5 07/23/2009 1338   EOSABS 0.2 07/23/2009 1338   BASOSABS 0.0 07/23/2009 1338   Iron/TIBC/Ferritin/ %Sat No results found for: IRON, TIBC, FERRITIN, IRONPCTSAT Lipid Panel  No results found for: CHOL, TRIG, HDL, CHOLHDL, VLDL, LDLCALC, LDLDIRECT Hepatic Function Panel     Component Value Date/Time   PROT 7.3 07/23/2009 1338   ALBUMIN 4.2 07/23/2009 1338   AST 17 07/23/2009 1338   ALT 13 07/23/2009 1338   ALKPHOS 79 07/23/2009 1338   BILITOT 0.4 07/23/2009 1338   No results found for: TSH   ECG  shows NSR with a rate of 73 BPM. INDIRECT CALORIMETER done today shows a VO2 of 320 and a REE of 2231.  Her calculated basal metabolic rate is 2297 thus her basal metabolic rate is better than expected.  OBESITY BEHAVIORAL INTERVENTION VISIT  Today's visit was # 1  Starting weight: 279 lbs Starting date: 08/02/2018 Today's weight : Weight: 279 lb (126.6 kg)  Today's date: 08/02/2018 Total lbs lost to date: 0    08/02/2018  Height  _0  (1.727 m)  Weight 279 lb (126.6 kg)  BMI (Calculated) 42.43  BLOOD PRESSURE - SYSTOLIC 010  BLOOD PRESSURE - DIASTOLIC 76  Waist Measurement  49 inches   Body  Fat % 46.5 %  Total Body Water (lbs) 94.2 lbs  RMR 2231   ASK: We discussed the diagnosis of obesity with Traci Sermon today and Sydne agreed to give [name] permission to discuss obesity behavioral modification therapy today.  ASSESS: Aalyah has the diagnosis of obesity and her BMI today is 42.4. Hosanna is in the action stage of change.   ADVISE: Raeli was educated on the multiple health risks of obesity as well as the benefit of weight loss to improve her health. She was advised of the need for long term treatment and the importance of lifestyle modifications to improve her current health and to decrease her risk of future health problems.  AGREE: Multiple dietary modification options and treatment options were discussed and Pearlena agreed to follow the recommendations documented in the above note.  ARRANGE: Jackalynn was educated on the importance of frequent visits to treat obesity as outlined per CMS and USPSTF guidelines and agreed to schedule her next follow up appointment today.  IMarcille Blanco, CMA, am acting as transcriptionist for Starlyn Skeans, MD I have reviewed the above documentation for accuracy and completeness, and I agree with the above. -Dennard Nip, MD

## 2018-08-03 LAB — COMPREHENSIVE METABOLIC PANEL
ALT: 11 IU/L (ref 0–32)
AST: 14 IU/L (ref 0–40)
Albumin/Globulin Ratio: 1.4 (ref 1.2–2.2)
Albumin: 4.1 g/dL (ref 3.8–4.8)
Alkaline Phosphatase: 77 IU/L (ref 39–117)
BUN/Creatinine Ratio: 15 (ref 9–23)
BUN: 12 mg/dL (ref 6–24)
Bilirubin Total: 0.3 mg/dL (ref 0.0–1.2)
CO2: 25 mmol/L (ref 20–29)
Calcium: 9.4 mg/dL (ref 8.7–10.2)
Chloride: 100 mmol/L (ref 96–106)
Creatinine, Ser: 0.8 mg/dL (ref 0.57–1.00)
GFR calc Af Amer: 106 mL/min/{1.73_m2} (ref >59)
GFR calc non Af Amer: 92 mL/min/{1.73_m2} (ref >59)
Globulin, Total: 3 g/dL (ref 1.5–4.5)
Glucose: 90 mg/dL (ref 65–99)
Potassium: 4.3 mmol/L (ref 3.5–5.2)
Sodium: 138 mmol/L (ref 134–144)
Total Protein: 7.1 g/dL (ref 6.0–8.5)

## 2018-08-03 LAB — TSH: TSH: 5.12 u[IU]/mL — ABNORMAL HIGH (ref 0.450–4.500)

## 2018-08-03 LAB — CBC WITH DIFFERENTIAL
Basophils Absolute: 0.1 10*3/uL (ref 0.0–0.2)
Basos: 1 %
EOS (ABSOLUTE): 0.2 10*3/uL (ref 0.0–0.4)
Eos: 5 %
Hematocrit: 37.2 % (ref 34.0–46.6)
Hemoglobin: 12.9 g/dL (ref 11.1–15.9)
Immature Grans (Abs): 0 10*3/uL (ref 0.0–0.1)
Immature Granulocytes: 0 %
Lymphocytes Absolute: 1.3 10*3/uL (ref 0.7–3.1)
Lymphs: 30 %
MCH: 31.1 pg (ref 26.6–33.0)
MCHC: 34.7 g/dL (ref 31.5–35.7)
MCV: 90 fL (ref 79–97)
Monocytes Absolute: 0.4 10*3/uL (ref 0.1–0.9)
Monocytes: 10 %
Neutrophils Absolute: 2.3 10*3/uL (ref 1.4–7.0)
Neutrophils: 54 %
RBC: 4.15 x10E6/uL (ref 3.77–5.28)
RDW: 13.6 % (ref 11.7–15.4)
WBC: 4.3 10*3/uL (ref 3.4–10.8)

## 2018-08-03 LAB — T4, FREE: Free T4: 0.91 ng/dL (ref 0.82–1.77)

## 2018-08-03 LAB — LIPID PANEL WITH LDL/HDL RATIO
Cholesterol, Total: 159 mg/dL (ref 100–199)
HDL: 41 mg/dL (ref >39)
LDL Calculated: 108 mg/dL — ABNORMAL HIGH (ref 0–99)
LDl/HDL Ratio: 2.6 ratio (ref 0.0–3.2)
Triglycerides: 49 mg/dL (ref 0–149)
VLDL Cholesterol Cal: 10 mg/dL (ref 5–40)

## 2018-08-03 LAB — HEMOGLOBIN A1C
Est. average glucose Bld gHb Est-mCnc: 111 mg/dL
Hgb A1c MFr Bld: 5.5 % (ref 4.8–5.6)

## 2018-08-03 LAB — SPECIMEN STATUS REPORT

## 2018-08-03 LAB — VITAMIN D 25 HYDROXY (VIT D DEFICIENCY, FRACTURES): Vit D, 25-Hydroxy: 20.7 ng/mL — ABNORMAL LOW (ref 30.0–100.0)

## 2018-08-03 LAB — T3: T3, Total: 97 ng/dL (ref 71–180)

## 2018-08-03 LAB — INSULIN, RANDOM: INSULIN: 14.4 u[IU]/mL (ref 2.6–24.9)

## 2018-08-03 LAB — FOLATE: Folate: 11.3 ng/mL (ref >3.0)

## 2018-08-03 LAB — VITAMIN B12: Vitamin B-12: 235 pg/mL (ref 232–1245)

## 2018-08-09 DIAGNOSIS — Z1331 Encounter for screening for depression: Secondary | ICD-10-CM | POA: Insufficient documentation

## 2018-08-09 DIAGNOSIS — R0602 Shortness of breath: Secondary | ICD-10-CM | POA: Insufficient documentation

## 2018-08-09 DIAGNOSIS — E559 Vitamin D deficiency, unspecified: Secondary | ICD-10-CM | POA: Insufficient documentation

## 2018-08-09 DIAGNOSIS — R739 Hyperglycemia, unspecified: Secondary | ICD-10-CM | POA: Insufficient documentation

## 2018-08-09 DIAGNOSIS — R5383 Other fatigue: Secondary | ICD-10-CM | POA: Insufficient documentation

## 2018-08-09 DIAGNOSIS — Z9189 Other specified personal risk factors, not elsewhere classified: Secondary | ICD-10-CM | POA: Insufficient documentation

## 2018-08-09 DIAGNOSIS — Z6841 Body Mass Index (BMI) 40.0 and over, adult: Secondary | ICD-10-CM | POA: Insufficient documentation

## 2018-08-15 NOTE — Progress Notes (Signed)
Office: [phone]  /  Fax: [phone]    Date: August 16, 2018   Appointment Start Time: 9:09am Duration: 41 minutes Provider: Glennie Isle, Psy.D. Type of Session: Intake for Individual Therapy  Location of Patient: Car Location of Provider: Provider's Home Type of Contact: Telepsychological Visit via [employer] Web[employer]  Informed Consent: This provider called Tina Hartman at 9:02am, as she did not present for today's appointment. She indicated she was driving to the office, as she forgot it was a virtual visit. Directions were provided for connecting and she indicated she is in the parking lot of the [facility]. As such, today's appointment was initiated 9 minutes late. Prior to proceeding with today's appointment, two pieces of identifying information were obtained from Tina Hartman to verify identity. In addition, Tina Hartman's physical location at the time of this appointment was obtained. Tina Hartman reported she was in her car in the parking lot of the [facility] and provided the address. In the event of technical difficulties, Tina Hartman shared a phone number she could be reached at. Tina Hartman and this provider participated in today's telepsychological service. Also, Tina Hartman denied anyone else being present in the car or on the WebEx appointment.   The provider's role was explained to [facility]. The provider reviewed and discussed issues of confidentiality, privacy, and limits therein (e.g., reporting obligations). In addition to verbal informed consent, written informed consent for psychological services was obtained from Tina Hartman prior to the initial intake interview. Written consent included information concerning the practice, financial arrangements, and confidentiality and patients' rights. Since the clinic is not a 24/7 crisis center, mental health emergency resources were shared, and the provider explained MyChart, e-mail, voicemail, and/or other messaging systems should be utilized  only for non-emergency reasons. This provider also explained that information obtained during appointments will be placed in Tina Hartman's medical record in a confidential manner and relevant information will be shared with other providers at [facility] that she meets with for coordination of care. Tina Hartman verbally acknowledged understanding of the aforementioned, and agreed to use mental health emergency resources discussed if needed. Moreover, Tina Hartman agreed information may be shared with other [facility] providers as needed for coordination of care. By signing the service agreement document, Tina Hartman provided written consent for coordination of care.   Prior to initiating telepsychological services, Tina Hartman was provided with an informed consent document, which included the development of a safety plan (i.e., an emergency contact and emergency resources) in the event of an emergency/crisis. Tina Hartman expressed understanding of the rationale of the safety plan and provided consent for this provider to reach out to her emergency contact in the event of an emergency/crisis. Tina Hartman returned the completed consent form prior to today's appointment. This provider verbally reviewed the consent form during today's appointment prior to proceeding with the appointment. Tina Hartman verbally acknowledged understanding that she is ultimately responsible for understanding her insurance benefits as it relates to reimbursement of telepsychological and in-person services. This provider also reviewed confidentiality, as it relates to telepsychological services, as well as the rationale for telepsychological services. More specifically, this provider's clinic is limiting in-person visits due to COVID-19. Therapeutic services will resume to in-person appointments once deemed appropriate. Tina Hartman expressed understanding regarding the rationale for telepsychological services. In addition, this provider explained the  telepsychological services informed consent document would be considered an addendum to the initial consent document/service agreement. Tina Hartman verbally consented to proceed.   Chief Complaint/HPI: Tina Hartman was referred by Dr. Leanne Chang  Tina Hartman. During the initial appointment with Tina Hartman at [facility] on August 02, 2018, Tina Hartman reported experiencing the following: significant food cravings issues , snacking frequently in the evenings, frequently drinking liquids with calories, frequently making poor food choices, binge eating behaviors, struggling with emotional eating and frequently skipping breakfast and lunch.   During today's appointment, Tina Hartman was verbally administered a questionnaire assessing various behaviors related to emotional eating. Tina Hartman endorsed the following: overeat when you are celebrating, experience food cravings on a regular basis, eat certain foods when you are anxious, stressed, depressed, or your feelings are hurt, use food to help you cope with emotional situations, find food is comforting to you, overeat when you are worried about something, overeat frequently when you are bored or lonely and not worry about what you eat when you are in a good mood. She shared she craves carbohydrates, including bread. Tina Hartman shared the onset of emotional eating was likely during her college years and noted it may be related to PCOS. She described the frequency as "every day." Tina Hartman noted the last episode of emotional eating was about three days ago and she consumed keto bars. Additionally, Tina Hartman denied a history of binge eating, but acknowledged engaging in grazing behaviors. Tina Hartman denied a history of restricting food intake, purging and engagement in other compensatory strategies, and has never been diagnosed with an eating disorder. She also denied a history of treatment for emotional eating. Moreover, Tina Hartman indicated stress from work and at home triggers emotional eating, whereas when  things are running smoothly and she feels in control makes emotional eating better. Furthermore, Shaquille denied other problems of concern.    Mental Status Examination:  Appearance: neat Behavior: cooperative Mood: euthymic Affect: mood congruent Speech: normal in rate, volume, and tone Eye Contact: appropriate Psychomotor Activity: appropriate Thought Process: linear, logical, and goal directed  Content/Perceptual Disturbances: denies suicidal and homicidal ideation, plan, and intent and no hallucinations, delusions, bizarre thinking or behavior reported or observed Orientation: time, person, place and purpose of appointment Cognition/Sensorium: memory, attention, language, and fund of knowledge intact  Insight: fair Judgment: fair  Family & Psychosocial History: Casandra reported she is "seeing somebody" and does not have any children. She noted, "I have custody of my niece and nephew." Merry reported her niece is 60 and her nephew is 85. 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 Additionally, Amiaya shared her highest level of education obtained is a master's degree. Currently, Daya's social support system consists of her mother, friends, and partner. Moreover, Genevia stated she resides with her nephew and niece.   Medical History:  Past Medical History:  Diagnosis Date   Acne    Anxiety    Back pain    CFS (chronic fatigue syndrome)    Constipation    Depression    Dyspnea    GERD (gastroesophageal reflux disease)    HLD (hyperlipidemia)    HTN (hypertension)    Infertility, female    Irregular menstruation    Joint pain    Lactose intolerance    Lower extremity edema    Obesity    PCOS (polycystic ovarian syndrome)    Sleep apnea    Vitamin D deficiency    Past Surgical History:  Procedure Laterality Date   BLADDER SURGERY     polyp removal   EYE SURGERY Right    Current Outpatient Medications on File Prior to Visit    Medication Sig Dispense Refill   OVER  THE COUNTER MEDICATION Take 1 tablet by mouth daily. Sunny mood vitamin     spironolactone (ALDACTONE) 100 MG tablet Take 100 mg by mouth daily.     No current facility-administered medications on file prior to visit.   Keonia denied a history of head injuries and loss of consciousness.   Mental Health History: Kagan denied a history of mental health treatment, including therapeutic services. Juliona denied a history of hospitalizations for psychiatric concerns, and has never met with a psychiatrist. However, she noted, "I was supposed to meet with somebody." She explained she was "stressed" with work in February of 2020 and she took time off from work. Around this time, Jaasia recalled experiencing weight gain. Regarding psychotropic medications, Aleisha stated she is prescribed Xanax and Lexapro by her PCP. She noted, "I don't take the Xanax." She was previously prescribed Wellbutrin. Ronell denied a family history of mental health related concerns.  Meili denied a trauma history, including psychological, physical  and sexual abuse, as well as neglect.   Harmoney described her typical mood as "social." Aside from concerns noted above and endorsed on the PHQ-9 and GAD-7, Eavan reported decreased self-esteem due to weight and current lifestyle changes and ongoing worry regarding the pandemic. Sharol endorsed current alcohol use on the weekends in the form of a standard pour of wine. She denied tobacco use. She denied illicit substance use. Regarding caffeine intake, Neeti reported consuming tea occassionally. Furthermore, Tina Hartman denied experiencing the following: hopelessness, obsessions and compulsions, hallucinations and delusions, paranoia, mania and decreased motivation. She also denied current suicidal ideation, plan, and intent; history of and current homicidal ideation, plan, and intent; and history of and current engagement in self-harm.  Regarding suicidal ideation,  Jerline reported experiencing thoughts such as "This is crazy. I just wants this to be done." She noted experiencing the aforementioned in February of 2020. She denied experiencing plan and intent. Dedria recalled she does not feel she was suicidal at that time rather stress and overwhelemed. She denied ever experiencing the aforementioned before. She also denied a history of experiencing suicidal ideation, plan, and intent. Irasema denied a history of suicide attempts. The following protective factors were identified for Cambrey: her future, including a desire for a family; mother; and desire to travel. If she were to become overwhelmed in the future, which is a sign that a crisis may occur, she identified the following coping skills she could engage in: delegate tasks, speak to employer, walk, pray, read Bible, and engage in self-care. It was recommended she write down the aforementioned to develop a coping card for future reference to assist with coping. She was observed writing in her car. Lilienne's confidence in utilizing emergency resources or reaching out to a trusted person should the feeling of being overwhelmed intensify and/or she is unable to ensure safety was assessed on a scale of one to ten where one is not confident and ten is extremely confident. She reported her confidence is a 10. She denied having access to firearms and weapons.   The following strengths were reported by Tina Hartman: outgoing; very motivated; giving; loving; caring; and responsible. The following strengths were observed by this provider: ability to express thoughts and feelings during the therapeutic session, ability to establish and benefit from a therapeutic relationship, ability to learn and practice coping skills, willingness to work toward established goal(s) with the clinic and ability to engage in reciprocal conversation.  Legal History: Zanobia denied a history of legal involvement.   Structured Assessment Results:  The Patient  Health Questionnaire-9 (PHQ-9) is a self-report measure that assesses symptoms and severity of depression over the course of the last two weeks. Fernande obtained a score of 2 suggesting minimal depression. Amana finds the endorsed symptoms to be somewhat difficult. Little interest or pleasure in doing things 1  Feeling down, depressed, or hopeless 0  Trouble falling or staying asleep, or sleeping too much 0  Feeling tired or having little energy 0  Poor appetite or overeating 0  Feeling bad about yourself --- or that you are a failure or have let yourself or your family down 1  Trouble concentrating on things, such as reading the newspaper or watching television 0  Moving or speaking so slowly that other people could have noticed? Or the opposite --- being so fidgety or restless that you have been moving around a lot more than usual 0  Thoughts that you would be better off dead or hurting yourself in some way 0  PHQ-9 Score 2    The Generalized Anxiety Disorder-7 (GAD-7) is a brief self-report measure that assesses symptoms of anxiety over the course of the last two weeks. Marasia obtained a score of 1 suggesting minimal anxiety. Toiya finds the endorsed symptoms to be somewhat difficult. Feeling nervous, anxious, on edge 0  Not being able to stop or control worrying 0  Worrying too much about different things 0  Trouble relaxing 0  Being so restless that it's hard to sit still 0  Becoming easily annoyed or irritable 1  Feeling afraid as if something awful might happen 0  GAD-7 Score 1   Interventions: A chart review was conducted prior to the clinical intake interview. The PHQ-9, and GAD-7 were verbally administered as well as a Mood and Food questionnaire to assess various behaviors related to emotional eating. Throughout session, empathic reflections and validation was provided. A risk assessment was completed, and a coping card was developed. Continuing treatment with this provider was  discussed and a treatment goal was established. Psychoeducation regarding emotional versus physical hunger was provided. Aujanae was sent a handout via e-mail to utilize between now and the next appointment to increase awareness of hunger patterns and subsequent eating. Tina Hartman provided verbal consent during today's appointment for this provider to send the handout via e-mail.   Provisional DSM-5 Diagnosis: 311 (F32.8) Other Specified Depressive Disorder, Emotional Eating Behaviors  Plan: Chauntae appears able and willing to participate as evidenced by collaboration on a treatment goal, engagement in reciprocal conversation, and asking questions as needed for clarification. The next appointment will be scheduled in two weeks, which will be via [employer]. The following treatment goal was established: decrease emotional eating. Once this provider's office resumes in-person appointments and it is deemed appropriate, Parneet will be notified. For the aforementioned goal, Deanie can benefit from biweekly individual therapy sessions that are brief in duration for approximately four to six sessions. The treatment modality will be individual therapeutic services, including an eclectic therapeutic approach utilizing techniques from Cognitive Behavioral Therapy, Patient Centered Therapy, Dialectical Behavior Therapy, Acceptance and Commitment Therapy, Interpersonal Therapy, and Cognitive Restructuring. Therapeutic approach will include various interventions as appropriate, such as validation, support, mindfulness, thought defusion, reframing, psychoeducation, values assessment, and role playing. This provider will regularly review the treatment plan and medical chart to keep informed of status changes. Tenea expressed understanding and agreement with the initial treatment plan of care.

## 2018-08-16 ENCOUNTER — Other Ambulatory Visit: Payer: Self-pay

## 2018-08-16 ENCOUNTER — Ambulatory Visit (INDEPENDENT_AMBULATORY_CARE_PROVIDER_SITE_OTHER): Payer: BC Managed Care – PPO | Admitting: Family Medicine

## 2018-08-16 ENCOUNTER — Encounter (INDEPENDENT_AMBULATORY_CARE_PROVIDER_SITE_OTHER): Payer: Self-pay | Admitting: Family Medicine

## 2018-08-16 ENCOUNTER — Ambulatory Visit (INDEPENDENT_AMBULATORY_CARE_PROVIDER_SITE_OTHER): Payer: BC Managed Care – PPO | Admitting: Psychology

## 2018-08-16 VITALS — BP 113/78 | HR 85 | Temp 98.1°F | Ht 68.0 in | Wt 277.0 lb

## 2018-08-16 DIAGNOSIS — E7849 Other hyperlipidemia: Secondary | ICD-10-CM

## 2018-08-16 DIAGNOSIS — Z6841 Body Mass Index (BMI) 40.0 and over, adult: Secondary | ICD-10-CM

## 2018-08-16 DIAGNOSIS — Z9189 Other specified personal risk factors, not elsewhere classified: Secondary | ICD-10-CM | POA: Diagnosis not present

## 2018-08-16 DIAGNOSIS — F3289 Other specified depressive episodes: Secondary | ICD-10-CM

## 2018-08-16 DIAGNOSIS — E559 Vitamin D deficiency, unspecified: Secondary | ICD-10-CM

## 2018-08-16 DIAGNOSIS — E8881 Metabolic syndrome: Secondary | ICD-10-CM

## 2018-08-16 MED ORDER — VITAMIN D (ERGOCALCIFEROL) 1.25 MG (50000 UNIT) PO CAPS: 50000.0000 [IU] | ORAL_CAPSULE | ORAL | 0 refills | Status: DC

## 2018-08-16 MED ORDER — METFORMIN HCL 500 MG PO TABS: 500.0000 mg | ORAL_TABLET | Freq: Every day | ORAL | 0 refills | Status: DC

## 2018-08-17 NOTE — Progress Notes (Signed)
Office: [phone]  /  Fax: [phone]   HPI:   Chief Complaint: OBESITY Tina Hartman is here to discuss her progress with her obesity treatment plan. She is on the Category 3 plan with the substitution of string cheese for milk and is following her eating plan approximately 40 % of the time. She states she is exercising 0 minutes 0 times per week. [name] didn't follow her plan closely. She didn't like a lot of the food choices, and she didn't do well with meal prepping. She is skipping meals sometimes, especially breakfast. Her weight is 277 lb (125.6 kg) today and has had a weight loss of 2 pounds over a period of 2 weeks since her last visit. She has lost 2 lbs since starting treatment with [name].  Mixed Hyperlipidemia Tyreesha has mixed hyperlipidemia. Her HDL is low and her LDL is elevated at 108. Katalyna is not on statin and she wants to try to control her cholesterol levels with intensive lifestyle modification including a low saturated fat diet, exercise and weight loss. She denies any chest pain.   Vitamin D deficiency [name] has a diagnosis of vitamin D deficiency. Her vitamin D level is low at 20.7 She is not currently taking vit D. [name] admits fatigue and she denies nausea, vomiting or muscle weakness.  Insulin Resistance (new) [name] has a new diagnosis of insulin resistance based on her elevated fasting insulin level >5. Although [name]'s blood glucose readings are still under good control, insulin resistance puts her at greater risk of metabolic syndrome and diabetes. [name] has a positive history of polycystic ovarian syndrome. [name] is not taking medications currently and she continues to work on diet and exercise to decrease risk of diabetes. [name] admits polyphagia and denies hypoglycemia.  At risk for diabetes [name] is at higher than average risk for developing diabetes due to her obesity and insulin resistance. She currently denies polyuria or polydipsia.  ASSESSMENT AND PLAN:   Vitamin D deficiency - Plan: Vitamin D, Ergocalciferol, (DRISDOL) 1.25 MG (50000 UT) CAPS capsule  Other hyperlipidemia  Insulin resistance - Plan: metFORMIN (GLUCOPHAGE) 500 MG tablet  At risk for diabetes mellitus  Class 3 severe obesity with serious comorbidity and body mass index (BMI) of 40.0 to 44.9 in adult, unspecified obesity type ([place])  PLAN:  Mixed Hyperlipidemia Jauna was informed of the American Heart Association Guidelines emphasizing intensive lifestyle modifications as the first line treatment for mixed hyperlipidemia. We discussed many lifestyle modifications today in depth, and Wessie will continue to work on decreasing saturated fats such as fatty red meat, butter and many fried foods. She will also increase vegetables and lean protein in her diet and continue to work on exercise and weight loss efforts. We will recheck labs in 3 months and Monque will follow up at the agreed upon time.  Vitamin D Deficiency [name] was informed that low vitamin D levels contributes to fatigue and are associated with obesity, breast, and colon cancer. She agrees to start prescription Vit D @50 ,000 IU every week #4 with no refills and will follow up for routine testing of vitamin D, at least 2-3 times per year. She was informed of the risk of over-replacement of vitamin D and agrees to not increase her dose unless she discusses this with [name] first. We will recheck labs in 3 months and [name] agrees to follow up as directed.  Insulin Resistance [name] will continue to work on weight loss, exercise, and decreasing simple carbohydrates in her diet to help  decrease the risk of diabetes. We dicussed metformin including benefits and risks. She was informed that eating too many simple carbohydrates or too many calories at one sitting increases the likelihood of GI side effects. [name] agrees to continue with diet and start metformin 500 mg qAM #30 with no refills. [name] agrees to follow up with us as directed  to monitor her progress. We will recheck labs in 3 months.  Diabetes risk counseling [name] was given extended (30 minutes) diabetes prevention counseling today. She is 43 y.o. female and has risk factors for diabetes including obesity and insulin resistance. We discussed intensive lifestyle modifications today with an emphasis on weight loss as well as increasing exercise and decreasing simple carbohydrates in her diet.  Obesity [name] is currently in the action stage of change. As such, her goal is to continue with weight loss efforts She has agreed to keep a food journal with 1400 to 1700 calories and 90+ grams of protein daily [name] has been instructed to work up to a goal of 150 minutes of combined cardio and strengthening exercise per week for weight loss and overall health benefits. We discussed the following Behavioral Modification Strategies today: increasing lean protein intake, decreasing simple carbohydrates  and work on meal planning and easy cooking plans  [name] has agreed to follow up with our clinic in 2 to 3 weeks. She was informed of the importance of frequent follow up visits to maximize her success with intensive lifestyle modifications for her multiple health conditions.  ALLERGIES: Allergies  Allergen Reactions  . Latex     MEDICATIONS: Current Outpatient Medications on File Prior to Visit  Medication Sig Dispense Refill  . OVER THE COUNTER MEDICATION Take 1 tablet by mouth daily. Sunny mood vitamin    . spironolactone (ALDACTONE) 100 MG tablet Take 100 mg by mouth daily.     No current facility-administered medications on file prior to visit.     PAST MEDICAL HISTORY: Past Medical History:  Diagnosis Date  . Acne   . Anxiety   . Back pain   . CFS (chronic fatigue syndrome)   . Constipation   . Depression   . Dyspnea   . GERD (gastroesophageal reflux disease)   . HLD (hyperlipidemia)   . HTN (hypertension)   . Infertility, female   . Irregular  menstruation   . Joint pain   . Lactose intolerance   . Lower extremity edema   . Obesity   . PCOS (polycystic ovarian syndrome)   . Sleep apnea   . Vitamin D deficiency     PAST SURGICAL HISTORY: Past Surgical History:  Procedure Laterality Date  . BLADDER SURGERY     polyp removal  . EYE SURGERY Right     SOCIAL HISTORY: Social History   Tobacco Use  . Smoking status: Never Smoker  . Smokeless tobacco: Never Used  Substance Use Topics  . Alcohol use: Yes  . Drug use: No    FAMILY HISTORY: Family History  Problem Relation Age of Onset  . Anxiety disorder Mother   . Sleep apnea Mother   . Hyperlipidemia Father     ROS: Review of Systems  Constitutional: Positive for malaise/fatigue and weight loss.  Cardiovascular: Negative for chest pain.  Gastrointestinal: Negative for nausea and vomiting.  Genitourinary: Negative for frequency.  Musculoskeletal:       Negative for muscle weakness  Endo/Heme/Allergies: Negative for polydipsia.       Positive for polyphagia Negative for hypoglycemia  PHYSICAL EXAM: Blood pressure 113/78, pulse 85, temperature 98.1 F (36.7 C), temperature source Oral, height 5\' 8"  (1.727 m), weight 277 lb (125.6 kg), last menstrual period 07/26/2018, SpO2 100 %. Body mass index is 42.12 kg/m. Physical Exam Vitals signs reviewed.  Constitutional:      Appearance: Normal appearance. She is well-developed. She is obese.  Cardiovascular:     Rate and Rhythm: Normal rate.  Pulmonary:     Effort: Pulmonary effort is normal.  Musculoskeletal: Normal range of motion.  Skin:    General: Skin is warm and dry.  Neurological:     Mental Status: She is alert and oriented to person, place, and time.  Psychiatric:        Mood and Affect: Mood normal.        Behavior: Behavior normal.     RECENT LABS AND TESTS: BMET    Component Value Date/Time   NA 138 08/02/2018 0000   K 4.3 08/02/2018 0000   CL 100 08/02/2018 0000   CO2 25  08/02/2018 0000   GLUCOSE 90 08/02/2018 0000   GLUCOSE 108 (H) 07/23/2009 1338   BUN 12 08/02/2018 0000   CREATININE 0.80 08/02/2018 0000   CALCIUM 9.4 08/02/2018 0000   GFRNONAA 92 08/02/2018 0000   GFRAA 106 08/02/2018 0000   Lab Results  Component Value Date   HGBA1C 5.5 08/02/2018   Lab Results  Component Value Date   INSULIN 14.4 08/02/2018   CBC    Component Value Date/Time   WBC 4.3 08/02/2018 0000   WBC 7.0 07/23/2009 1338   WBC 6.3 12/19/2007 0013   RBC 4.15 08/02/2018 0000   RBC 3.91 07/23/2009 1338   RBC 4.40 12/19/2007 0013   HGB 12.9 08/02/2018 0000   HGB 12.6 07/23/2009 1338   HCT 37.2 08/02/2018 0000   HCT 36.9 07/23/2009 1338   PLT 284 07/23/2009 1338   MCV 90 08/02/2018 0000   MCV 94.3 07/23/2009 1338   MCH 31.1 08/02/2018 0000   MCH 32.1 07/23/2009 1338   MCHC 34.7 08/02/2018 0000   MCHC 34.1 07/23/2009 1338   MCHC 33.9 12/19/2007 0013   RDW 13.6 08/02/2018 0000   RDW 15.0 (H) 07/23/2009 1338   LYMPHSABS 1.3 08/02/2018 0000   LYMPHSABS 1.9 07/23/2009 1338   MONOABS 0.5 07/23/2009 1338   EOSABS 0.2 08/02/2018 0000   BASOSABS 0.1 08/02/2018 0000   BASOSABS 0.0 07/23/2009 1338   Iron/TIBC/Ferritin/ %Sat No results found for: IRON, TIBC, FERRITIN, IRONPCTSAT Lipid Panel     Component Value Date/Time   CHOL 159 08/02/2018 0000   TRIG 49 08/02/2018 0000   HDL 41 08/02/2018 0000   LDLCALC 108 (H) 08/02/2018 0000   Hepatic Function Panel     Component Value Date/Time   PROT 7.1 08/02/2018 0000   ALBUMIN 4.1 08/02/2018 0000   AST 14 08/02/2018 0000   ALT 11 08/02/2018 0000   ALKPHOS 77 08/02/2018 0000   BILITOT 0.3 08/02/2018 0000      Component Value Date/Time   TSH 5.120 (H) 08/02/2018 0000     Ref. Range 08/02/2018 00:00  Vitamin D, 25-Hydroxy Latest Ref Range: 30.0 - 100.0 ng/mL 20.7 (L)    OBESITY BEHAVIORAL INTERVENTION VISIT  Today's visit was # 2   Starting weight: 279 lbs Starting date: 08/02/2018 Today's weight : 277 lbs   Today's date: 08/16/2018 Total lbs lost to date: 2    08/16/2018  Height 5\' 8"  (1.727 m)  Weight 277 lb (125.6 kg)  BMI (Calculated) 42.13  BLOOD PRESSURE - SYSTOLIC 113  BLOOD PRESSURE - DIASTOLIC 78   Body Fat % 48.2 %  Total Body Water (lbs) 93.6 lbs    ASK: We discussed the diagnosis of obesity with Kennieth RadErica Gaal today and [name] agreed to give us permission to discuss obesity behavioral modification therapy today.  ASSESS: [name] has the diagnosis of obesity and her BMI today is 42.13 [name] is in the action stage of change   ADVISE: [name] was educated on the multiple health risks of obesity as well as the benefit of weight loss to improve her health. She was advised of the need for long term treatment and the importance of lifestyle modifications to improve her current health and to decrease her risk of future health problems.  AGREE: Multiple dietary modification options and treatment options were discussed and  Mianna agreed to follow the recommendations documented in the above note.  ARRANGE: [name] was educated on the importance of frequent visits to treat obesity as outlined per CMS and USPSTF guidelines and agreed to schedule her next follow up appointment today.  I, [place] CraneJoanne Murray, am acting as transcriptionist for Quillian Quincearen Maral Lampe, MD  I have reviewed the above documentation for accuracy and completeness, and I agree with the above. -Quillian Quincearen Zen Felling, MD

## 2018-08-30 NOTE — Progress Notes (Signed)
Office: [phone]  /  Fax: [phone]    Date: August 31, 2018   Appointment Start Time: 4:05pm Duration: 25 minutes Provider: Glennie Isle, Psy.D. Type of Session: Individual Therapy  Location of Patient: Car- parking lot of [facility] Location of Provider: [facility] Type of Contact: Telepsychological Visit via [employer] Web[employer]   Session Content: Tina Hartman is a 43 y.o. female presenting via [place] for a follow-up appointment to address the previously established treatment goal of decreasing emotional eating. Of note, this provider called Tina Hartman at 4:03pm as she did not present for today's appointment. She believed her appointment was with Dr. Leafy Ro; therefore, she was in the parking lot. Tina Hartman was informed her appointment was with this provider and it was a telepsychological visit. As such, today's appointment was initiated 5 minutes late. Today's appointment was a telepsychological visit, as this provider's clinic is seeing a limited number of patients for in-person visits due to COVID-19. Therapeutic services will resume to in-person appointments once deemed appropriate. Tina Hartman expressed understanding regarding the rationale for telepsychological services, and provided verbal consent for today's appointment. Prior to proceeding with today's appointment, Tina Hartman's physical location at the time of this appointment was obtained. Tina Hartman reported she was in her car in the parking lot of the [facility]. In the event of technical difficulties, Tina Hartman shared a phone number she could be reached at. Tina Hartman and this provider participated in today's telepsychological service. Also, Tina Hartman denied anyone else being present in the car or on the WebEx appointment.  This provider conducted a brief check-in and verbally administered the PHQ-9 and GAD-7. 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 She also  reported ongoing worry regarding her niece and the pandemic. Regarding eating, she noted, "I am an under cover stressful eater." She added, "As long as I'm busy, I'm good." Moreover, psychoeducation regarding triggers for emotional eating was provided. Tina Hartman was provided a handout, and encouraged to utilize the handout between now and the next appointment to increase awareness of triggers and frequency. Tina Hartman agreed. This provider also discussed behavioral strategies for specific triggers, such as placing the utensil down when conversing to avoid mindless eating. Tina Hartman provided verbal consent during today's appointment for this provider to send the handout via e-mail. Overall, Tina Hartman was receptive to today's session as evidenced by openness to sharing, responsiveness to feedback, and willingness to explore triggers for emotional eating.  Mental Status Examination:  Appearance: neat Behavior: cooperative Mood: euthymic Affect: mood congruent Speech: normal in rate, volume, and tone Eye Contact: appropriate Psychomotor Activity: appropriate Thought Process: linear, logical, and goal directed  Content/Perceptual Disturbances: denies suicidal and homicidal ideation, plan, and intent and no hallucinations, delusions, bizarre thinking or behavior reported or observed Orientation: time, person, place and purpose of appointment Cognition/Sensorium: memory, attention, language, and fund of knowledge intact  Insight: good Judgment: good  Structured Assessment Results: The Patient Health Questionnaire-9 (PHQ-9) is a self-report measure that assesses symptoms and severity of depression over the course of the last two weeks. Tina Hartman obtained a score of 1 suggesting minimal depression. Tina Hartman finds the endorsed symptoms to be not difficult at all. Little interest or pleasure in doing things 0  Feeling down, depressed, or hopeless 0  Trouble falling or staying asleep, or sleeping too much 0  Feeling tired or  having little energy 0  Poor appetite or overeating 0  Feeling bad about yourself ---  or that you are a failure or have let yourself or your family down 1  Trouble concentrating on things, such as reading the newspaper or watching television 0  Moving or speaking so slowly that other people could have noticed? Or the opposite --- being so fidgety or restless that you have been moving around a lot more than usual 0  Thoughts that you would be better off dead or hurting yourself in some way 0  PHQ-9 Score 1    The Generalized Anxiety Disorder-7 (GAD-7) is a brief self-report measure that assesses symptoms of anxiety over the course of the last two weeks. [name] obtained a score of 1 suggesting minimal anxiety. Tina Hartman finds the endorsed symptoms to be not difficult at all. Feeling nervous, anxious, on edge 0  Not being able to stop or control worrying 0  Worrying too much about different things 1  Trouble relaxing 0  Being so restless that it's hard to sit still 0  Becoming easily annoyed or irritable 0  Feeling afraid as if something awful might happen 0  GAD-7 Score 1   Interventions:  Conducted a brief chart review Verbal administration of PHQ-9 and GAD-7 for symptom monitoring Provided empathic reflections and validation Reviewed content from the previous session Psychoeducation provided regarding triggers for emotional eating Focused on rapport building Employed supportive psychotherapy interventions to facilitate reduced distress, and to improve coping skills with identified stressors  DSM-5 Diagnosis: 311 (F32.8) Other Specified Depressive Disorder, Emotional Eating Behaviors  Treatment Goal & Progress: During the initial appointment with this provider, the following treatment goal was established: decrease emotional eating. Progress is limited, as Tina Hartman has just begun treatment with this provider; however, she is receptive to the interaction and interventions and rapport is being  established.    Plan: Tina Hartman continues to appear able and willing to participate as evidenced by engagement in reciprocal conversation, and asking questions for clarification as appropriate. The next appointment will be scheduled in 2-3 weeks, which will be via [employer] WebEx. Once this provider's office resumes in-person appointments and it is deemed appropriate, Tina Hartman will be notified. The next session will focus on reviewing triggers for emotional eating and the introduction of mindfulness.

## 2018-08-31 ENCOUNTER — Ambulatory Visit (INDEPENDENT_AMBULATORY_CARE_PROVIDER_SITE_OTHER): Payer: BC Managed Care – PPO | Admitting: Psychology

## 2018-08-31 ENCOUNTER — Other Ambulatory Visit: Payer: Self-pay

## 2018-08-31 DIAGNOSIS — F3289 Other specified depressive episodes: Secondary | ICD-10-CM

## 2018-09-05 ENCOUNTER — Other Ambulatory Visit (INDEPENDENT_AMBULATORY_CARE_PROVIDER_SITE_OTHER): Payer: Self-pay | Admitting: Family Medicine

## 2018-09-05 DIAGNOSIS — E559 Vitamin D deficiency, unspecified: Secondary | ICD-10-CM

## 2018-09-06 ENCOUNTER — Ambulatory Visit (INDEPENDENT_AMBULATORY_CARE_PROVIDER_SITE_OTHER): Payer: BC Managed Care – PPO | Admitting: Family Medicine

## 2018-09-06 ENCOUNTER — Encounter (INDEPENDENT_AMBULATORY_CARE_PROVIDER_SITE_OTHER): Payer: Self-pay | Admitting: Family Medicine

## 2018-09-06 ENCOUNTER — Other Ambulatory Visit: Payer: Self-pay

## 2018-09-06 VITALS — BP 105/74 | HR 86 | Temp 98.0°F | Ht 68.0 in | Wt 273.0 lb

## 2018-09-06 DIAGNOSIS — Z6841 Body Mass Index (BMI) 40.0 and over, adult: Secondary | ICD-10-CM

## 2018-09-06 DIAGNOSIS — E559 Vitamin D deficiency, unspecified: Secondary | ICD-10-CM | POA: Diagnosis not present

## 2018-09-06 DIAGNOSIS — E8881 Metabolic syndrome: Secondary | ICD-10-CM

## 2018-09-06 DIAGNOSIS — Z9189 Other specified personal risk factors, not elsewhere classified: Secondary | ICD-10-CM

## 2018-09-06 MED ORDER — VITAMIN D (ERGOCALCIFEROL) 1.25 MG (50000 UNIT) PO CAPS: 50000.0000 [IU] | ORAL_CAPSULE | ORAL | 0 refills | Status: DC

## 2018-09-06 MED ORDER — METFORMIN HCL 500 MG PO TABS: 500.0000 mg | ORAL_TABLET | Freq: Two times a day (BID) | ORAL | 0 refills | Status: DC

## 2018-09-06 NOTE — Progress Notes (Signed)
Office: [phone]  /  Fax: [phone]   HPI:   Chief Complaint: OBESITY Tina Hartman is here to discuss her progress with her obesity treatment plan. She is on the  keep a food journal with 1400-1700 calories and 90+g of protein  daily and is following her eating plan approximately 25 % of the time. She states she is just started with personal training.  Tina Hartman was changed to journaling and started but does not do it regularly. She notes increased walking and school stress. She is still eating out regularly.  Her weight is 273 lb (123.8 kg) today and has had a weight loss of 4 pounds over a period of 2 weeks since her last visit. She has lost 6 lbs since starting treatment with [name].  Pre-Diabetes Tina Hartman has a diagnosis of prediabetes based on her elevated HgA1c and was informed this puts her at greater risk of developing diabetes. She is taking metformin currently and continues to work on diet and exercise to decrease risk of diabetes. She denies nausea, vomiting,  or hypoglycemia. She reports polyphagia.   Vitamin D deficiency Tina Hartman has a diagnosis of vitamin D deficiency. She is currently taking vit D and denies nausea, vomiting or muscle weakness. Vitamin D level not yet at goal.   At risk for diabetes Tina Hartman is at higher than averagerisk for developing diabetes due to her obesity. She currently denies polyuria or polydipsia.  ASSESSMENT AND PLAN:  Insulin resistance - Plan: metFORMIN (GLUCOPHAGE) 500 MG tablet  Vitamin D deficiency - Plan: Vitamin D, Ergocalciferol, (DRISDOL) 1.25 MG (50000 UT) CAPS capsule  At risk for diabetes mellitus  Class 3 severe obesity with serious comorbidity and body mass index (BMI) of 40.0 to 44.9 in adult, unspecified obesity type ([place])  PLAN: Pre-Diabetes Tina Hartman will continue to work on weight loss, exercise, and decreasing simple carbohydrates in her diet to help decrease the risk of diabetes. We dicussed metformin including benefits and risks. She was  informed that eating too many simple carbohydrates or too many calories at one sitting increases the likelihood of GI side effects. Tina Hartman agrees to increase Metformin to 500 mg BID #60 with no refill sent today. Tina Hartman agreed to follow up with [name] as directed to monitor her progress.  Vitamin D Deficiency [place] was informed that low vitamin D levels contributes to fatigue and are associated with obesity, breast, and colon cancer. She agrees to continue to take prescription Vit D @50 ,000 IU every week #4 with no refills sent today and will follow up for routine testing of vitamin D, at least 2-3 times per year. She was informed of the risk of over-replacement of vitamin D and agrees to not increase her dose unless she discusses this with [name] first. Agrees to follow up with our clinic as directed.   Diabetes risk counselling Tina Hartman was given extended (15 minutes) diabetes prevention counseling today. She is 43 y.o. female and has risk factors for diabetes including obesity. We discussed intensive lifestyle modifications today with an emphasis on weight loss as well as increasing exercise and decreasing simple carbohydrates in her diet.  Obesity Tina Hartman is currently in the action stage of change. As such, her goal is to continue with weight loss efforts She has agreed to keep a food journal with 1400-1700 calories and 90+g of protein daily.  Tina Hartman has been instructed to work up to a goal of 150 minutes of combined cardio and strengthening exercise per week for weight loss and overall health benefits. We  discussed the following Behavioral Modification Stratagies today:keeping strict food journal, increasing lean protein intake, increasing vegetables and decrease eating out/    Tina Hartman has agreed to follow up with our clinic in 2 weeks. She was informed of the importance of frequent follow up visits to maximize her success with intensive lifestyle modifications for her multiple health conditions.  ALLERGIES:  Allergies  Allergen Reactions  . Latex     MEDICATIONS: Current Outpatient Medications on File Prior to Visit  Medication Sig Dispense Refill  . OVER THE COUNTER MEDICATION Take 1 tablet by mouth daily. Sunny mood vitamin    . spironolactone (ALDACTONE) 100 MG tablet Take 100 mg by mouth daily.     No current facility-administered medications on file prior to visit.     PAST MEDICAL HISTORY: Past Medical History:  Diagnosis Date  . Acne   . Anxiety   . Back pain   . CFS (chronic fatigue syndrome)   . Constipation   . Depression   . Dyspnea   . GERD (gastroesophageal reflux disease)   . HLD (hyperlipidemia)   . HTN (hypertension)   . Infertility, female   . Irregular menstruation   . Joint pain   . Lactose intolerance   . Lower extremity edema   . Obesity   . PCOS (polycystic ovarian syndrome)   . Sleep apnea   . Vitamin D deficiency     PAST SURGICAL HISTORY: Past Surgical History:  Procedure Laterality Date  . BLADDER SURGERY     polyp removal  . EYE SURGERY Right     SOCIAL HISTORY: Social History   Tobacco Use  . Smoking status: Never Smoker  . Smokeless tobacco: Never Used  Substance Use Topics  . Alcohol use: Yes  . Drug use: No    FAMILY HISTORY: Family History  Problem Relation Age of Onset  . Anxiety disorder Mother   . Sleep apnea Mother   . Hyperlipidemia Father     ROS: Review of Systems  Constitutional: Positive for weight loss.  Gastrointestinal: Negative for nausea and vomiting.  Musculoskeletal:       Negative for muscle weakness  Endo/Heme/Allergies:       Negative for hypoglycemia    PHYSICAL EXAM: Blood pressure 105/74, pulse 86, temperature 98 F (36.7 C), temperature source Oral, height 5\' 8"  (1.727 m), weight 273 lb (123.8 kg), last menstrual period 08/21/2018, SpO2 99 %. Body mass index is 41.51 kg/m. Physical Exam  RECENT LABS AND TESTS: BMET    Component Value Date/Time   NA 138 08/02/2018 0000   K 4.3  08/02/2018 0000   CL 100 08/02/2018 0000   CO2 25 08/02/2018 0000   GLUCOSE 90 08/02/2018 0000   GLUCOSE 108 (H) 07/23/2009 1338   BUN 12 08/02/2018 0000   CREATININE 0.80 08/02/2018 0000   CALCIUM 9.4 08/02/2018 0000   GFRNONAA 92 08/02/2018 0000   GFRAA 106 08/02/2018 0000   Lab Results  Component Value Date   HGBA1C 5.5 08/02/2018   Lab Results  Component Value Date   INSULIN 14.4 08/02/2018   CBC    Component Value Date/Time   WBC 4.3 08/02/2018 0000   WBC 7.0 07/23/2009 1338   WBC 6.3 12/19/2007 0013   RBC 4.15 08/02/2018 0000   RBC 3.91 07/23/2009 1338   RBC 4.40 12/19/2007 0013   HGB 12.9 08/02/2018 0000   HGB 12.6 07/23/2009 1338   HCT 37.2 08/02/2018 0000   HCT 36.9 07/23/2009 1338   PLT  284 07/23/2009 1338   MCV 90 08/02/2018 0000   MCV 94.3 07/23/2009 1338   MCH 31.1 08/02/2018 0000   MCH 32.1 07/23/2009 1338   MCHC 34.7 08/02/2018 0000   MCHC 34.1 07/23/2009 1338   MCHC 33.9 12/19/2007 0013   RDW 13.6 08/02/2018 0000   RDW 15.0 (H) 07/23/2009 1338   LYMPHSABS 1.3 08/02/2018 0000   LYMPHSABS 1.9 07/23/2009 1338   MONOABS 0.5 07/23/2009 1338   EOSABS 0.2 08/02/2018 0000   BASOSABS 0.1 08/02/2018 0000   BASOSABS 0.0 07/23/2009 1338   Iron/TIBC/Ferritin/ %Sat No results found for: IRON, TIBC, FERRITIN, IRONPCTSAT Lipid Panel     Component Value Date/Time   CHOL 159 08/02/2018 0000   TRIG 49 08/02/2018 0000   HDL 41 08/02/2018 0000   LDLCALC 108 (H) 08/02/2018 0000   Hepatic Function Panel     Component Value Date/Time   PROT 7.1 08/02/2018 0000   ALBUMIN 4.1 08/02/2018 0000   AST 14 08/02/2018 0000   ALT 11 08/02/2018 0000   ALKPHOS 77 08/02/2018 0000   BILITOT 0.3 08/02/2018 0000      Component Value Date/Time   TSH 5.120 (H) 08/02/2018 0000     Ref. Range 08/02/2018 00:00  Vitamin D, 25-Hydroxy Latest Ref Range: 30.0 - 100.0 ng/mL 20.7 (L)     OBESITY BEHAVIORAL INTERVENTION VISIT  Today's visit was # 3   Starting weight: 279  lb Starting date: 08/02/18 Today's weight : Weight: 273 lb (123.8 kg)  Today's date: 09/06/2018 Total lbs lost to date: 6 lbs    ASK: We discussed the diagnosis of obesity with Tina Hartman today and Tina DroughtErica agreed to give us permission to discuss obesity behavioral modification therapy today.  ASSESS: Tina Hartman has the diagnosis of obesity and her BMI today is 41.52 Tina Hartman is in the action stage of change   ADVISE: Tina Hartman was educated on the multiple health risks of obesity as well as the benefit of weight loss to improve her health. She was advised of the need for long term treatment and the importance of lifestyle modifications to improve her current health and to decrease her risk of future health problems.  AGREE: Multiple dietary modification options and treatment options were discussed and  Tina Hartman agreed to follow the recommendations documented in the above note.  ARRANGE: Tina Hartman was educated on the importance of frequent visits to treat obesity as outlined per CMS and USPSTF guidelines and agreed to schedule her next follow up appointment today.  I, Tina PetersAshleigh Hartman, am acting as transcriptionist for Quillian Quincearen Semone Orlov, MD   I have reviewed the above documentation for accuracy and completeness, and I agree with the above. -Quillian Quincearen Odis Turck, MD

## 2018-09-07 ENCOUNTER — Other Ambulatory Visit (INDEPENDENT_AMBULATORY_CARE_PROVIDER_SITE_OTHER): Payer: Self-pay | Admitting: Family Medicine

## 2018-09-07 DIAGNOSIS — E8881 Metabolic syndrome: Secondary | ICD-10-CM

## 2018-09-15 NOTE — Progress Notes (Unsigned)
Office: [phone]  /  Fax: [phone]    Date: September 19, 2018   Appointment Start Time:*** Duration:*** Provider: Glennie Isle, Psy.D. Type of Session: Individual Therapy  Location of Patient: *** Location of Provider: {Location of Service:[address]} Type of Contact: Telepsychological Visit via [employer] Web[employer]   Session Content: Tina Hartman is a 43 y.o. female presenting via [place] for a follow-up appointment to address the previously established treatment goal of decreasing emotional eating. Today's appointment was a telepsychological visit, as this provider's clinic is seeing a limited number of patients for in-person visits due to COVID-19. Therapeutic services will resume to in-person appointments once deemed appropriate. [name] expressed understanding regarding the rationale for telepsychological services, and provided verbal consent for today's appointment. Prior to proceeding with today's appointment, [name]'s physical location at the time of this appointment was obtained. [name] reported she was at *** and provided the address. In the event of technical difficulties, Hira shared a phone number she could be reached at. [name] and this provider participated in today's telepsychological service. Also, [name] denied anyone else being present in the room or on the WebEx appointment ***.  This provider conducted a brief check-in and verbally administered the PHQ-9 and GAD-7. ***   [name] was receptive to today's session as evidenced by openness to sharing, responsiveness to feedback, and ***.  Mental Status Examination:  Appearance: {Appearance:22431} Behavior: {Behavior:22445} Mood: {Teletherapy mood:22435} Affect: {Affect:22436} Speech: {Speech:22432} Eye Contact: {Eye Contact:22433} Psychomotor Activity: {Motor Activity:22434} Thought Process: {thought process:22448}  Content/Perceptual Disturbances: {disturbances:22451} Orientation: {Orientation:22437} Cognition/Sensorium:  {gbcognition:22449} Insight: {Insight:22446} Judgment: {Insight:22446}  Structured Assessment Results: The Patient Health Questionnaire-9 (PHQ-9) is a self-report measure that assesses symptoms and severity of depression over the course of the last two weeks. [name] obtained a score of *** suggesting {GBPHQ9SEVERITY:21752}. [name] finds the endorsed symptoms to be {gbphq9difficulty:21754}. Little interest or pleasure in doing things ***  Feeling down, depressed, or hopeless ***  Trouble falling or staying asleep, or sleeping too much ***  Feeling tired or having little energy ***  Poor appetite or overeating ***  Feeling bad about yourself --- or that you are a failure or have let yourself or your family down ***  Trouble concentrating on things, such as reading the newspaper or watching television ***  Moving or speaking so slowly that other people could have noticed? Or the opposite --- being so fidgety or restless that you have been moving around a lot more than usual ***  Thoughts that you would be better off dead or hurting yourself in some way ***  PHQ-9 Score ***    The Generalized Anxiety Disorder-7 (GAD-7) is a brief self-report measure that assesses symptoms of anxiety over the course of the last two weeks. [name] obtained a score of *** suggesting {gbgad7severity:21753}. [name] finds the endorsed symptoms to be {gbphq9difficulty:21754}. Feeling nervous, anxious, on edge ***  Not being able to stop or control worrying ***  Worrying too much about different things ***  Trouble relaxing ***  Being so restless that it's hard to sit still ***  Becoming easily annoyed or irritable ***  Feeling afraid as if something awful might happen ***  GAD-7 Score ***   Interventions:  {Interventions:22172}  DSM-5 Diagnosis: 311 (F32.8) Other Specified Depressive Disorder, Emotional Eating Behaviors  Treatment Goal & Progress: During the initial appointment with this provider, the following  treatment goal was established: decrease emotional eating. [name] has demonstrated progress in her goal as evidenced by ***  Plan: Stellarose continues to appear able  and willing to participate as evidenced by engagement in reciprocal conversation, and asking questions for clarification as appropriate. The next appointment will be scheduled in {gbweeks:21758}, which will be via [employer] WebEx. Once this provider's office resumes in-person appointments and it is deemed appropriate, [name] will be notified. The next session will focus on reviewing learned skills, and working towards the established treatment goal.***

## 2018-09-19 ENCOUNTER — Ambulatory Visit (INDEPENDENT_AMBULATORY_CARE_PROVIDER_SITE_OTHER): Payer: Self-pay | Admitting: Psychology

## 2018-09-19 ENCOUNTER — Telehealth (INDEPENDENT_AMBULATORY_CARE_PROVIDER_SITE_OTHER): Payer: Self-pay | Admitting: Psychology

## 2018-09-19 ENCOUNTER — Ambulatory Visit (INDEPENDENT_AMBULATORY_CARE_PROVIDER_SITE_OTHER): Payer: BC Managed Care – PPO | Admitting: Family Medicine

## 2018-09-19 NOTE — Telephone Encounter (Signed)
  Office: [phone]  /  Fax: [phone]  Date of Call: September 19, 2018  Time of Call: 8:34am Provider: Glennie Isle, PsyD  CONTENT: This provider called Tina Hartman to check-in as she did not present for today's Webex appointment at 8:30am. A HIPAA compliant voicemail could not be left as [employer] box was full. Of note, this provider stayed on the [facility] appointment for 10 minutes prior to signing off.   PLAN: This provider or the provider's clinic will follow-up with Tina Hartman.

## 2018-10-03 ENCOUNTER — Other Ambulatory Visit: Payer: Self-pay

## 2018-10-03 ENCOUNTER — Ambulatory Visit (INDEPENDENT_AMBULATORY_CARE_PROVIDER_SITE_OTHER): Payer: BC Managed Care – PPO | Admitting: Family Medicine

## 2018-10-03 ENCOUNTER — Encounter (INDEPENDENT_AMBULATORY_CARE_PROVIDER_SITE_OTHER): Payer: Self-pay | Admitting: Family Medicine

## 2018-10-03 VITALS — BP 117/75 | HR 92 | Temp 98.2°F | Ht 68.0 in | Wt 274.0 lb

## 2018-10-03 DIAGNOSIS — Z9189 Other specified personal risk factors, not elsewhere classified: Secondary | ICD-10-CM

## 2018-10-03 DIAGNOSIS — Z6841 Body Mass Index (BMI) 40.0 and over, adult: Secondary | ICD-10-CM

## 2018-10-03 DIAGNOSIS — E559 Vitamin D deficiency, unspecified: Secondary | ICD-10-CM | POA: Diagnosis not present

## 2018-10-03 DIAGNOSIS — E8881 Metabolic syndrome: Secondary | ICD-10-CM

## 2018-10-03 NOTE — Progress Notes (Signed)
Office: [phone]  /  Fax: [phone]    Date: October 04, 2018   Appointment Start Time: 8:35am Duration: 25 minutes Provider: Lawerance CruelGaytri Taresa [place], Psy.D. Type of Session: Individual Therapy  Location of Patient: Home Location of Provider: Provider's Home Type of Contact: Telepsychological Visit via [employer] WebEx   Session Content: Tina Hartman is a 43 y.o. female presenting via [employer] Web[employer] for a follow-up appointment to address the previously established treatment goal of decreasing emotional eating. Of note, this provider called Tina Hartman at 8:32am, as she did not present for today's appointment. The e-mail with the secure link was re-sent. As such, today's appointment was initiated 5 minutes late. Today's appointment was a telepsychological visit, as this provider's clinic is seeing a limited number of patients for in-person visits due to COVID-19. Therapeutic services will resume to in-person appointments once deemed appropriate. Tina Hartman expressed understanding regarding the rationale for telepsychological services, and provided verbal consent for today's appointment. Prior to proceeding with today's appointment, Tina Hartman's physical location at the time of this appointment was obtained. Tina Hartman reported she was at home and provided the address. In the event of technical difficulties, Tina Hartman shared a phone number she could be reached at. Tina Hartman and this provider participated in today's telepsychological service. Also, Tina Hartman denied anyone else being present in the room or on the WebEx appointment.  This provider conducted a brief check-in and verbally administered the PHQ-9 and GAD-7. Tina Hartman shared, "My dog died. That's the major thing we've been dealing with." Associated thoughts and feelings were briefly processed. Regarding eating, Tina Hartman shared experiencing cravings when she is experiencing stress. She described having increased awareness about her cravings and triggers that are playing a role. Tina Hartman further shared a  reduction in emotional eating since the onset of emotional eating with this provider. This was positively reinforced. Psychoeducation regarding mindfulness was provided. A handout was provided to Tina Hartman with further information regarding mindfulness, including exercises. This provider also explained the benefit of mindfulness as it relates to emotional eating. Tina Hartman was encouraged to engage in the provided exercises between now and the next appointment with this provider. Tina Hartman agreed. She was led through an exercise involving her senses during today's appointment. Tina Hartman provided verbal consent during today's appointment for this provider to send the handout on mindfulness via e-mail. Overall, Tina Hartman was receptive to today's session as evidenced by openness to sharing, responsiveness to feedback, and willingness to engage in mindfulness exercises.  Mental Status Examination:  Appearance: neat Behavior: cooperative Mood: euthymic Affect: mood congruent Speech: normal in rate, volume, and tone Eye Contact: appropriate Psychomotor Activity: appropriate Thought Process: linear, logical, and goal directed  Content/Perceptual Disturbances: denies suicidal and homicidal ideation, plan, and intent and no hallucinations, delusions, bizarre thinking or behavior reported or observed Orientation: time, person, place and purpose of appointment Cognition/Sensorium: memory, attention, language, and fund of knowledge intact  Insight: good Judgment: good  Structured Assessment Results: The Patient Health Questionnaire-9 (PHQ-9) is a self-report measure that assesses symptoms and severity of depression over the course of the last two weeks. Tina Hartman obtained a score of 0. Little interest or pleasure in doing things 0  Feeling down, depressed, or hopeless 0  Trouble falling or staying asleep, or sleeping too much 0  Feeling tired or having little energy 0  Poor appetite or overeating 0  Feeling bad about yourself ---  or that you are a failure or have let yourself or your family down 0  Trouble concentrating on things, such as reading the newspaper or  watching television 0  Moving or speaking so slowly that other people could have noticed? Or the opposite --- being so fidgety or restless that you have been moving around a lot more than usual 0  Thoughts that you would be better off dead or hurting yourself in some way 0  PHQ-9 Score 0    The Generalized Anxiety Disorder-7 (GAD-7) is a brief self-report measure that assesses symptoms of anxiety over the course of the last two weeks. Tina Hartman obtained a score of 3 suggesting minimal anxiety. Tina Hartman finds the endorsed symptoms to be not difficult at all. Feeling nervous, anxious, on edge 1  Not being able to stop or control worrying 0  Worrying too much about different things 2  Trouble relaxing 0  Being so restless that it's hard to sit still 0  Becoming easily annoyed or irritable 0  Feeling afraid as if something awful might happen 0  GAD-7 Score 3   Interventions:  Conducted a brief chart review Verbal administration of PHQ-9 and GAD-7 for symptom monitoring Provided empathic reflections and validation Reviewed content from the previous session Processed thoughts and feelings Psychoeducation provided regarding mindfulness Engaged patient in a mindfulness exercise Provided positive reinforcement Employed supportive psychotherapy interventions to facilitate reduced distress, and to improve coping skills with identified stressors Employed acceptance and commitment interventions to emphasize mindfulness and acceptance without struggle  DSM-5 Diagnosis: 311 (F32.8) Other Specified Depressive Disorder, Emotional Eating Behaviors  Treatment Goal & Progress: During the initial appointment with this provider, the following treatment goal was established: decrease emotional eating. Tina Hartman has demonstrated progress in her goal as evidenced by increased awareness of  hunger patterns and triggers for emotional eating. Since the onset of treatment with this provider, Tina Hartman reported a decrease in emotional eating.   Plan: Tina Hartman continues to appear able and willing to participate as evidenced by engagement in reciprocal conversation, and asking questions for clarification as appropriate. The next appointment will be scheduled in two weeks, which will be via [employer]. Once this provider's office resumes in-person appointments and it is deemed appropriate, Tina Hartman will be notified. The next session will focus further on mindfulness.

## 2018-10-04 ENCOUNTER — Ambulatory Visit (INDEPENDENT_AMBULATORY_CARE_PROVIDER_SITE_OTHER): Payer: BC Managed Care – PPO | Admitting: Psychology

## 2018-10-04 DIAGNOSIS — F3289 Other specified depressive episodes: Secondary | ICD-10-CM

## 2018-10-04 MED ORDER — METFORMIN HCL 500 MG PO TABS: 500.0000 mg | ORAL_TABLET | Freq: Two times a day (BID) | ORAL | 0 refills | Status: DC

## 2018-10-04 MED ORDER — VITAMIN D (ERGOCALCIFEROL) 1.25 MG (50000 UNIT) PO CAPS: 50000.0000 [IU] | ORAL_CAPSULE | ORAL | 0 refills | Status: DC

## 2018-10-05 NOTE — Progress Notes (Signed)
Office: [phone]  /  Fax: [phone]   HPI:   Chief Complaint: OBESITY Tina Hartman is here to discuss her progress with her obesity treatment plan. She is on the Category 3 plan/journaling and is following her eating plan approximately 15% of the time. She states she is walking 30 minutes 3 times per week. Tina Hartman went off track in the last week with the death of her dog and decreased meal planning. She states she recognizes where she went wrong and is ready to get back on track.  Her weight is 274 lb (124.3 kg) today and has had a weight gain of 1 lb since her last visit. She has lost 5 lbs since starting treatment with [name].  Pre-Diabetes Tina Hartman has a diagnosis of prediabetes based on her elevated Hgb A1c and was informed this puts her at greater risk of developing diabetes. She is stable on metformin currently and continues to work on diet and exercise to decrease risk of diabetes. Anavey states she has struggled with an increase in simple carbs in the last month. She denies nausea, vomiting, or hypoglycemia.  At risk for diabetes Tina Hartman is at higher than average risk for developing diabetes due to her obesity. She currently denies polyuria or polydipsia.  Vitamin D deficiency Tina Hartman has a diagnosis of Vitamin D deficiency, which is not yet at goal. She is currently stable on prescription Vit D and denies nausea, vomiting or muscle weakness.  ASSESSMENT AND PLAN:  Insulin resistance - Plan: metFORMIN (GLUCOPHAGE) 500 MG tablet  Vitamin D deficiency - Plan: Vitamin D, Ergocalciferol, (DRISDOL) 1.25 MG (50000 UT) CAPS capsule  At risk for diabetes mellitus  Class 3 severe obesity with serious comorbidity and body mass index (BMI) of 40.0 to 44.9 in adult, unspecified obesity type ([place])  PLAN:  Pre-Diabetes Tina Hartman will continue to work on weight loss, exercise, and decreasing simple carbohydrates in her diet to help decrease the risk of diabetes. We dicussed metformin including benefits  and risks. She was informed that eating too many simple carbohydrates or too many calories at one sitting increases the likelihood of GI side effects. Tina Hartman was given a refill on her metformin 500 mg #60 with 0 refills and agrees to follow-up with our clinic in 2 weeks.  Diabetes risk counseling Tina Hartman was given extended (15 minutes) diabetes prevention counseling today. She is 43 y.o. female and has risk factors for diabetes including obesity. We discussed intensive lifestyle modifications today with an emphasis on weight loss as well as increasing exercise and decreasing simple carbohydrates in her diet.  Vitamin D Deficiency Tina Hartman was informed that low Vitamin D levels contributes to fatigue and are associated with obesity, breast, and colon cancer. She agrees to continue to take prescription Vit D @ 50,000 IU every week #4 with 0 refills and will follow-up for routine testing of Vitamin D, at least 2-3 times per year. She was informed of the risk of over-replacement of Vitamin D and agrees to not increase her dose unless she discusses this with [name] first. Tina Hartman agrees to follow-up with our clinic in 2 weeks.  Obesity Tina Hartman is currently in the action stage of change. As such, her goal is to continue with weight loss efforts. She has agreed to follow the Category 3 plan. Tina Hartman has been instructed to work up to a goal of 150 minutes of combined cardio and strengthening exercise per week for weight loss and overall health benefits. We discussed the following Behavioral Modification Strategies today: increasing lean  protein intake and decreasing simple carbohydrates.  Tina Hartman has agreed to follow-up with our clinic in 2 weeks. She was informed of the importance of frequent follow-up visits to maximize her success with intensive lifestyle modifications for her multiple health conditions.  ALLERGIES: Allergies  Allergen Reactions   Latex     MEDICATIONS: Current Outpatient Medications on File Prior  to Visit  Medication Sig Dispense Refill   OVER THE COUNTER MEDICATION Take 1 tablet by mouth daily. Sunny mood vitamin     spironolactone (ALDACTONE) 100 MG tablet Take 100 mg by mouth daily.     No current facility-administered medications on file prior to visit.     PAST MEDICAL HISTORY: Past Medical History:  Diagnosis Date   Acne    Anxiety    Back pain    CFS (chronic fatigue syndrome)    Constipation    Depression    Dyspnea    GERD (gastroesophageal reflux disease)    HLD (hyperlipidemia)    HTN (hypertension)    Infertility, female    Irregular menstruation    Joint pain    Lactose intolerance    Lower extremity edema    Obesity    PCOS (polycystic ovarian syndrome)    Sleep apnea    Vitamin D deficiency     PAST SURGICAL HISTORY: Past Surgical History:  Procedure Laterality Date   BLADDER SURGERY     polyp removal   EYE SURGERY Right     SOCIAL HISTORY: Social History   Tobacco Use   Smoking status: Never Smoker   Smokeless tobacco: Never Used  Substance Use Topics   Alcohol use: Yes   Drug use: No    FAMILY HISTORY: Family History  Problem Relation Age of Onset   Anxiety disorder Mother    Sleep apnea Mother    Hyperlipidemia Father    ROS: Review of Systems  Gastrointestinal: Negative for nausea and vomiting.  Musculoskeletal:       Negative for muscle weakness.  Endo/Heme/Allergies:       Negative for hypoglycemia.   PHYSICAL EXAM: Blood pressure 117/75, pulse 92, temperature 98.2 F (36.8 C), temperature source Oral, height 5\' 8"  (1.727 m), weight 274 lb (124.3 kg), SpO2 100 %. Body mass index is 41.66 kg/m. Physical Exam Vitals signs reviewed.  Constitutional:      Appearance: Normal appearance. She is obese.  Cardiovascular:     Rate and Rhythm: Normal rate.     Pulses: Normal pulses.  Pulmonary:     Effort: Pulmonary effort is normal.     Breath sounds: Normal breath sounds.    Musculoskeletal: Normal range of motion.  Skin:    General: Skin is warm and dry.  Neurological:     Mental Status: She is alert and oriented to person, place, and time.  Psychiatric:        Behavior: Behavior normal.   RECENT LABS AND TESTS: BMET    Component Value Date/Time   NA 138 08/02/2018 0000   K 4.3 08/02/2018 0000   CL 100 08/02/2018 0000   CO2 25 08/02/2018 0000   GLUCOSE 90 08/02/2018 0000   GLUCOSE 108 (H) 07/23/2009 1338   BUN 12 08/02/2018 0000   CREATININE 0.80 08/02/2018 0000   CALCIUM 9.4 08/02/2018 0000   GFRNONAA 92 08/02/2018 0000   GFRAA 106 08/02/2018 0000   Lab Results  Component Value Date   HGBA1C 5.5 08/02/2018   Lab Results  Component Value Date   INSULIN 14.4  08/02/2018   CBC    Component Value Date/Time   WBC 4.3 08/02/2018 0000   WBC 7.0 07/23/2009 1338   WBC 6.3 12/19/2007 0013   RBC 4.15 08/02/2018 0000   RBC 3.91 07/23/2009 1338   RBC 4.40 12/19/2007 0013   HGB 12.9 08/02/2018 0000   HGB 12.6 07/23/2009 1338   HCT 37.2 08/02/2018 0000   HCT 36.9 07/23/2009 1338   PLT 284 07/23/2009 1338   MCV 90 08/02/2018 0000   MCV 94.3 07/23/2009 1338   MCH 31.1 08/02/2018 0000   MCH 32.1 07/23/2009 1338   MCHC 34.7 08/02/2018 0000   MCHC 34.1 07/23/2009 1338   MCHC 33.9 12/19/2007 0013   RDW 13.6 08/02/2018 0000   RDW 15.0 (H) 07/23/2009 1338   LYMPHSABS 1.3 08/02/2018 0000   LYMPHSABS 1.9 07/23/2009 1338   MONOABS 0.5 07/23/2009 1338   EOSABS 0.2 08/02/2018 0000   BASOSABS 0.1 08/02/2018 0000   BASOSABS 0.0 07/23/2009 1338   Iron/TIBC/Ferritin/ %Sat No results found for: IRON, TIBC, FERRITIN, IRONPCTSAT Lipid Panel     Component Value Date/Time   CHOL 159 08/02/2018 0000   TRIG 49 08/02/2018 0000   HDL 41 08/02/2018 0000   LDLCALC 108 (H) 08/02/2018 0000   Hepatic Function Panel     Component Value Date/Time   PROT 7.1 08/02/2018 0000   ALBUMIN 4.1 08/02/2018 0000   AST 14 08/02/2018 0000   ALT 11 08/02/2018 0000    ALKPHOS 77 08/02/2018 0000   BILITOT 0.3 08/02/2018 0000      Component Value Date/Time   TSH 5.120 (H) 08/02/2018 0000   Results for Kennieth RadMAYE, [place] (MRN [id number]) as of 10/05/2018 08:28  Ref. Range 08/02/2018 00:00  Vitamin D, 25-Hydroxy Latest Ref Range: 30.0 - 100.0 ng/mL 20.7 (L)   OBESITY BEHAVIORAL INTERVENTION VISIT  Today's visit was #4   Starting weight: 279 lbs Starting date: 08/02/2018 Today's weight: 274 lbs  Today's date: 10/03/2018 Total lbs lost to date: 5    10/03/2018  Height 5\' 8"  (1.727 m)  Weight 274 lb (124.3 kg)  BMI (Calculated) 41.67  BLOOD PRESSURE - SYSTOLIC 117  BLOOD PRESSURE - DIASTOLIC 75   Body Fat % 46.7 %  Total Body Water (lbs) 92.6 lbs   ASK: We discussed the diagnosis of obesity with Kennieth RadErica Deloatch today and Tina Hartman agreed to give us permission to discuss obesity behavioral modification therapy today.  ASSESS: Tina Hartman has the diagnosis of obesity and her BMI today is 41.7. Tina Hartman is in the action stage of change.   ADVISE: Tina Hartman was educated on the multiple health risks of obesity as well as the benefit of weight loss to improve her health. She was advised of the need for long term treatment and the importance of lifestyle modifications to improve her current health and to decrease her risk of future health problems.  AGREE: Multiple dietary modification options and treatment options were discussed and  Chasitty agreed to follow the recommendations documented in the above note.  ARRANGE: Tina Hartman was educated on the importance of frequent visits to treat obesity as outlined per CMS and USPSTF guidelines and agreed to schedule her next follow up appointment today.  I, Marianna Paymentenise Haag, am acting as Energy managertranscriptionist for Quillian Quincearen Alese Furniss, MD I have reviewed the above documentation for accuracy and completeness, and I agree with the above. -Quillian Quincearen Tyneshia Stivers, MD

## 2018-10-08 ENCOUNTER — Other Ambulatory Visit (INDEPENDENT_AMBULATORY_CARE_PROVIDER_SITE_OTHER): Payer: Self-pay | Admitting: Family Medicine

## 2018-10-08 DIAGNOSIS — E559 Vitamin D deficiency, unspecified: Secondary | ICD-10-CM

## 2018-10-10 ENCOUNTER — Other Ambulatory Visit (INDEPENDENT_AMBULATORY_CARE_PROVIDER_SITE_OTHER): Payer: Self-pay | Admitting: Family Medicine

## 2018-10-10 DIAGNOSIS — E8881 Metabolic syndrome: Secondary | ICD-10-CM

## 2018-10-18 NOTE — Progress Notes (Signed)
Office: [phone]  /  Fax: [phone]    Date: October 19, 2018   Appointment Start Time: 2:30pm Duration: 27 minutes Provider: Glennie Isle, Psy.D. Type of Session: Individual Therapy  Location of Patient: Car in parking lot at [employer] of Provider: [facility] Type of Contact: Telepsychological Visit via [employer]   Session Content: Tina Hartman is a 43 y.o. female presenting via [place] for a follow-up appointment to address the previously established treatment goal of decreasing emotional eating. Today's appointment was a telepsychological visit, as this provider's clinic is seeing a limited number of patients for in-person visits due to COVID-19. Therapeutic services will resume to in-person appointments once deemed appropriate. Tina Hartman expressed understanding regarding the rationale for telepsychological services, and provided verbal consent for today's appointment. Prior to proceeding with today's appointment, Tina Hartman's physical location at the time of this appointment was obtained. Tina Hartman reported she was in her car in the parking lot of [employer] and provided the location. In the event of technical difficulties, Tina Hartman shared a phone number she could be reached at. Tina Hartman and this provider participated in today's telepsychological service. Also, Savahna denied anyone else being in the car or on the WebEx appointment.  This provider conducted a brief check-in and verbally administered the PHQ-9 and GAD-7. Adalynd stated she went on a trip for her birthday. She also shared experiencing worry as her 69 year old niece wants to move in with her boyfriend. Regarding eating, Tina Hartman stated, "It's going." She believes that when she is unable to eat what she wants to, she does not eat at all. This was explored further. She shared she is currently journaling; therefore, this provider explored obstacles/barriers for Tina Hartman feeling as she is unable to eat what she wants to. Tina Hartman  reported sometimes having the desire to eat pizza. As such, all or nothing thinking was reviewed and this provider discussed eating smaller portions of certain foods Tina Hartman used to eat in order for her to meet her established protein/calorie goals. Tina Hartman also reported a belief that if she eats less than the calories allocated, she is doing better. This was explored further and psychoeducation regarding the importance of meeting calorie and protein goals was provided. Furthermore, Tina Hartman stated, "I've accomplished everything I've wanted, but this weight loss." She added, "I should be further along than I am." To assist with the aforementioned, goal setting was discussed. A goal of meeting her protein and calorie goals 5 out of 7 days out of the week between now and the next appointment with this provider was established as Tina Hartman reported meeting her calories/protein goal 3 out of 7 days of the week. Tina Hartman was receptive to today's session as evidenced by openness to sharing, responsiveness to feedback, and willingness to work toward the established goal. She added, "I feel good."   Mental Status Examination:  Appearance: neat Behavior: cooperative Mood: euthymic Affect: mood congruent Speech: normal in rate, volume, and tone Tina Contact: appropriate Psychomotor Activity: appropriate Thought Process: linear, logical, and goal directed  Content/Perceptual Disturbances: denies suicidal and homicidal ideation, plan, and intent and no hallucinations, delusions, bizarre thinking or behavior reported or observed Orientation: time, person, place and purpose of appointment Cognition/Sensorium: memory, attention, language, and fund of knowledge intact  Insight: good Judgment: good  Structured Assessment Results: The Patient Health Questionnaire-9 (PHQ-9) is a self-report measure that assesses symptoms and severity of depression over the course of the last two weeks. Chasty obtained a score of 0. Little interest  or pleasure in doing things 0  Feeling down, depressed, or hopeless 0  Trouble falling or staying asleep, or sleeping too much 0  Feeling tired or having little energy 0  Poor appetite or overeating 0  Feeling bad about yourself --- or that you are a failure or have let yourself or your family down 0  Trouble concentrating on things, such as reading the newspaper or watching television 0  Moving or speaking so slowly that other people could have noticed? Or the opposite --- being so fidgety or restless that you have been moving around a lot more than usual 0  Thoughts that you would be better off dead or hurting yourself in some way 0  PHQ-9 Score 0    The Generalized Anxiety Disorder-7 (GAD-7) is a brief self-report measure that assesses symptoms of anxiety over the course of the last two weeks. Tina Hartman obtained a score of 2 suggesting minimal anxiety. Tina Hartman finds the endorsed symptoms to be somewhat difficult. Feeling nervous, anxious, on edge 2  Not being able to stop or control worrying 0  Worrying too much about different things 0  Trouble relaxing 0  Being so restless that it's hard to sit still 0  Becoming easily annoyed or irritable 0  Feeling afraid as if something awful might happen 0  GAD-7 Score 2   Interventions:  Conducted a brief chart review Verbal administration of PHQ-9 and GAD-7 for symptom monitoring Provided empathic reflections and validation Engaged patient in goal setting Processed thoughts and feelings Psychoeducation provided regarding the hunger and satisfaction scale  DSM-5 Diagnosis: 311 (F32.8) Other Specified Depressive Disorder, Emotional Eating Behaviors  Treatment Goal & Progress: During the initial appointment with this provider, the following treatment goal was established: decrease emotional eating. Tina Hartman has demonstrated progress in her goal as evidenced by increased awareness of hunger patterns and triggers for emotional eating.   Plan: Tina Hartman  continues to appear able and willing to participate as evidenced by engagement in reciprocal conversation, and asking questions for clarification as appropriate. The next appointment will be scheduled in three weeks, which will be via [employer] Web[employer]. The next session will focus further on mindfulness as it was not discussed today due to Isadore's presenting concerns.

## 2018-10-19 ENCOUNTER — Ambulatory Visit (INDEPENDENT_AMBULATORY_CARE_PROVIDER_SITE_OTHER): Payer: BC Managed Care – PPO | Admitting: Psychology

## 2018-10-19 ENCOUNTER — Other Ambulatory Visit: Payer: Self-pay

## 2018-10-19 DIAGNOSIS — F3289 Other specified depressive episodes: Secondary | ICD-10-CM | POA: Diagnosis not present

## 2018-10-24 ENCOUNTER — Ambulatory Visit (INDEPENDENT_AMBULATORY_CARE_PROVIDER_SITE_OTHER): Payer: BC Managed Care – PPO | Admitting: Family Medicine

## 2018-10-27 ENCOUNTER — Encounter (INDEPENDENT_AMBULATORY_CARE_PROVIDER_SITE_OTHER): Payer: Self-pay | Admitting: Family Medicine

## 2018-10-27 ENCOUNTER — Other Ambulatory Visit: Payer: Self-pay

## 2018-10-27 ENCOUNTER — Ambulatory Visit (INDEPENDENT_AMBULATORY_CARE_PROVIDER_SITE_OTHER): Payer: BC Managed Care – PPO | Admitting: Family Medicine

## 2018-10-27 VITALS — BP 108/74 | HR 99 | Temp 98.5°F | Ht 68.0 in | Wt 268.0 lb

## 2018-10-27 DIAGNOSIS — K5909 Other constipation: Secondary | ICD-10-CM

## 2018-10-27 DIAGNOSIS — E8881 Metabolic syndrome: Secondary | ICD-10-CM | POA: Diagnosis not present

## 2018-10-27 DIAGNOSIS — Z9189 Other specified personal risk factors, not elsewhere classified: Secondary | ICD-10-CM | POA: Diagnosis not present

## 2018-10-27 DIAGNOSIS — E559 Vitamin D deficiency, unspecified: Secondary | ICD-10-CM

## 2018-10-27 DIAGNOSIS — Z6841 Body Mass Index (BMI) 40.0 and over, adult: Secondary | ICD-10-CM

## 2018-10-27 MED ORDER — METFORMIN HCL 500 MG PO TABS: 500.0000 mg | ORAL_TABLET | Freq: Two times a day (BID) | ORAL | 0 refills | Status: DC

## 2018-10-27 MED ORDER — VITAMIN D (ERGOCALCIFEROL) 1.25 MG (50000 UNIT) PO CAPS: 50000.0000 [IU] | ORAL_CAPSULE | ORAL | 0 refills | Status: DC

## 2018-11-01 NOTE — Progress Notes (Signed)
Office: [phone]  /  Fax: [phone]   HPI:   Chief Complaint: OBESITY Tina Hartman is here to discuss her progress with her obesity treatment plan. She is on the Category 3 plan and is following her eating plan approximately 80 % of the time. She states she is walking for 45-60 minutes 5 times per week. Tina Hartman continues to do very well with weight loss on her journaling plan. Her hunger is controlled and she is working on meeting her protein goals.  Her weight is 268 lb (121.6 kg) today and has had a weight loss of 6 pounds over a period of 3 weeks since her last visit. She has lost 11 lbs since starting treatment with us.  Insulin Resistance Tina Hartman has a diagnosis of insulin resistance based on her elevated fasting insulin level >5. Although [name]'s blood glucose readings are still under good control, insulin resistance puts her at greater risk of metabolic syndrome and diabetes. She is stable on metformin and denies nausea, vomiting, or hypoglycemia. She continues to work on diet and exercise to decrease risk of diabetes.  At risk for diabetes Tina Hartman is at higher than average risk for developing diabetes due to her obesity and insulin resistance. She currently denies polyuria or polydipsia.  Vitamin D Deficiency Tina Hartman has a diagnosis of vitamin D deficiency. She is stable on prescription Vit D, but level is not yet at goal. She denies nausea, vomiting or muscle weakness.  Constipation Tina Hartman notes constipation. She notes BM are less frequent and are now becoming hard and somewhat painful. She denies abdominal pain, hematochezia, or melena.   ASSESSMENT AND PLAN:  Insulin resistance - Plan: metFORMIN (GLUCOPHAGE) 500 MG tablet  Vitamin D deficiency - Plan: Vitamin D, Ergocalciferol, (DRISDOL) 1.25 MG (50000 UT) CAPS capsule  Other constipation  At risk for diabetes mellitus  Class 3 severe obesity with serious comorbidity and body mass index (BMI) of 40.0 to 44.9 in adult,  unspecified obesity type (HCC)  PLAN:  Insulin Resistance Tina Hartman will continue to work on weight loss, exercise, and decreasing simple carbohydrates in her diet to help decrease the risk of diabetes. We dicussed metformin including benefits and risks. She was informed that eating too many simple carbohydrates or too many calories at one sitting increases the likelihood of GI side effects. Tina Hartman agrees to continue taking metformin 500 mg PO BID #60 and we will refill for 1 month. We will recheck labs at her next visit. Tina Hartman agrees to follow up with our clinic in 2 to 3 weeks as directed to monitor her progress.  Diabetes risk counseling Tina Hartman was given extended (15 minutes) diabetes prevention counseling today. She is 43 y.o. female and has risk factors for diabetes including obesity and insulin resistance. We discussed intensive lifestyle modifications today with an emphasis on weight loss as well as increasing exercise and decreasing simple carbohydrates in her diet.  Vitamin D Deficiency Tina Hartman was informed that low vitamin D levels contributes to fatigue and are associated with obesity, breast, and colon cancer. Tina Hartman agrees to continue taking prescription Vit D 50,000 IU every week #4 and we will refill for 1 month. She will follow up for routine testing of vitamin D, at least 2-3 times per year. She was informed of the risk of over-replacement of vitamin D and agrees to not increase her dose unless she discusses this with us first. We will recheck labs at her next visit. Tina Hartman agrees to follow up with our clinic in 2 to 3  weeks.  Constipation Tina Hartman was informed decrease bowel movement frequency is normal while losing weight, but stools should not be hard or painful. She was advised to increase her H20 intake and work on increasing her fiber intake. High fiber foods were discussed today. Tina Hartman agrees to start OTC Colace 100 mg BID and will continue to monitor. Tina Hartman agrees to follow up with our  clinic in 2 to 3 weeks.  Obesity Tina Hartman is currently in the action stage of change. As such, her goal is to continue with weight loss efforts She has agreed to keep a food journal with 1500 calories and 85+ grams of protein daily Tina Hartman has been instructed to work up to a goal of 150 minutes of combined cardio and strengthening exercise per week for weight loss and overall health benefits. We discussed the following Behavioral Modification Strategies today: increasing lean protein intake, increasing vegetables, increase H20 intake, and increasing fiber rich foods   Tina Hartman has agreed to follow up with our clinic in 2 to 3 weeks. She was informed of the importance of frequent follow up visits to maximize her success with intensive lifestyle modifications for her multiple health conditions.  ALLERGIES: Allergies  Allergen Reactions  . Latex     MEDICATIONS: Current Outpatient Medications on File Prior to Visit  Medication Sig Dispense Refill  . OVER THE COUNTER MEDICATION Take 1 tablet by mouth daily. Sunny mood vitamin    . spironolactone (ALDACTONE) 100 MG tablet Take 100 mg by mouth daily.     No current facility-administered medications on file prior to visit.     PAST MEDICAL HISTORY: Past Medical History:  Diagnosis Date  . Acne   . Anxiety   . Back pain   . CFS (chronic fatigue syndrome)   . Constipation   . Depression   . Dyspnea   . GERD (gastroesophageal reflux disease)   . HLD (hyperlipidemia)   . HTN (hypertension)   . Infertility, female   . Irregular menstruation   . Joint pain   . Lactose intolerance   . Lower extremity edema   . Obesity   . PCOS (polycystic ovarian syndrome)   . Sleep apnea   . Vitamin D deficiency     PAST SURGICAL HISTORY: Past Surgical History:  Procedure Laterality Date  . BLADDER SURGERY     polyp removal  . EYE SURGERY Right     SOCIAL HISTORY: Social History   Tobacco Use  . Smoking status: Never Smoker  . Smokeless  tobacco: Never Used  Substance Use Topics  . Alcohol use: Yes  . Drug use: No    FAMILY HISTORY: Family History  Problem Relation Age of Onset  . Anxiety disorder Mother   . Sleep apnea Mother   . Hyperlipidemia Father     ROS: Review of Systems  Constitutional: Positive for weight loss.  Gastrointestinal: Positive for constipation. Negative for abdominal pain, melena, nausea and vomiting.       Negative hematochezia  Genitourinary: Negative for frequency.  Musculoskeletal:       Negative muscle weakness  Endo/Heme/Allergies: Negative for polydipsia.       Negative hypoglycemia    PHYSICAL EXAM: Blood pressure 108/74, pulse 99, temperature 98.5 F (36.9 C), temperature source Oral, height 5\' 8"  (1.727 m), weight 268 lb (121.6 kg), last menstrual period 10/16/2018, SpO2 98 %. Body mass index is 40.75 kg/m. Physical Exam Vitals signs reviewed.  Constitutional:      Appearance: Normal appearance. She is obese.  Cardiovascular:     Rate and Rhythm: Normal rate.     Pulses: Normal pulses.  Pulmonary:     Effort: Pulmonary effort is normal.     Breath sounds: Normal breath sounds.  Musculoskeletal: Normal range of motion.  Skin:    General: Skin is warm and dry.  Neurological:     Mental Status: She is alert and oriented to person, place, and time.  Psychiatric:        Mood and Affect: Mood normal.        Behavior: Behavior normal.     RECENT LABS AND TESTS: BMET    Component Value Date/Time   NA 138 08/02/2018 0000   K 4.3 08/02/2018 0000   CL 100 08/02/2018 0000   CO2 25 08/02/2018 0000   GLUCOSE 90 08/02/2018 0000   GLUCOSE 108 (H) 07/23/2009 1338   BUN 12 08/02/2018 0000   CREATININE 0.80 08/02/2018 0000   CALCIUM 9.4 08/02/2018 0000   GFRNONAA 92 08/02/2018 0000   GFRAA 106 08/02/2018 0000   Lab Results  Component Value Date   HGBA1C 5.5 08/02/2018   Lab Results  Component Value Date   INSULIN 14.4 08/02/2018   CBC    Component Value  Date/Time   WBC 4.3 08/02/2018 0000   WBC 7.0 07/23/2009 1338   WBC 6.3 12/19/2007 0013   RBC 4.15 08/02/2018 0000   RBC 3.91 07/23/2009 1338   RBC 4.40 12/19/2007 0013   HGB 12.9 08/02/2018 0000   HGB 12.6 07/23/2009 1338   HCT 37.2 08/02/2018 0000   HCT 36.9 07/23/2009 1338   PLT 284 07/23/2009 1338   MCV 90 08/02/2018 0000   MCV 94.3 07/23/2009 1338   MCH 31.1 08/02/2018 0000   MCH 32.1 07/23/2009 1338   MCHC 34.7 08/02/2018 0000   MCHC 34.1 07/23/2009 1338   MCHC 33.9 12/19/2007 0013   RDW 13.6 08/02/2018 0000   RDW 15.0 (H) 07/23/2009 1338   LYMPHSABS 1.3 08/02/2018 0000   LYMPHSABS 1.9 07/23/2009 1338   MONOABS 0.5 07/23/2009 1338   EOSABS 0.2 08/02/2018 0000   BASOSABS 0.1 08/02/2018 0000   BASOSABS 0.0 07/23/2009 1338   Iron/TIBC/Ferritin/ %Sat No results found for: IRON, TIBC, FERRITIN, IRONPCTSAT Lipid Panel     Component Value Date/Time   CHOL 159 08/02/2018 0000   TRIG 49 08/02/2018 0000   HDL 41 08/02/2018 0000   LDLCALC 108 (H) 08/02/2018 0000   Hepatic Function Panel     Component Value Date/Time   PROT 7.1 08/02/2018 0000   ALBUMIN 4.1 08/02/2018 0000   AST 14 08/02/2018 0000   ALT 11 08/02/2018 0000   ALKPHOS 77 08/02/2018 0000   BILITOT 0.3 08/02/2018 0000      Component Value Date/Time   TSH 5.120 (H) 08/02/2018 0000      OBESITY BEHAVIORAL INTERVENTION VISIT  Today's visit was # 5   Starting weight: 279 lbs Starting date: 08/02/2018 Today's weight : 268 lbs Today's date: 10/27/2018 Total lbs lost to date: 11    ASK: We discussed the diagnosis of obesity with Kennieth Rad today and Juwana agreed to give [name] permission to discuss obesity behavioral modification therapy today.  ASSESS: Brock has the diagnosis of obesity and her BMI today is 40.76 Jazara is in the action stage of change   ADVISE: Eleanore was educated on the multiple health risks of obesity as well as the benefit of weight loss to improve her health. She was advised of  the need  for long term treatment and the importance of lifestyle modifications to improve her current health and to decrease her risk of future health problems.  AGREE: Multiple dietary modification options and treatment options were discussed and  Tina Hartman agreed to follow the recommendations documented in the above note.  ARRANGE: Tina Hartman was educated on the importance of frequent visits to treat obesity as outlined per CMS and USPSTF guidelines and agreed to schedule her next follow up appointment today.  I, Burt KnackSharon Martin, am acting as transcriptionist for Quillian Quincearen Beasley, MD  I have reviewed the above documentation for accuracy and completeness, and I agree with the above. -Quillian Quincearen Beasley, MD

## 2018-11-02 NOTE — Progress Notes (Unsigned)
Office: [phone]  /  Fax: [phone]    Date: November 10, 2018   Appointment Start Time:*** Duration:*** Provider: Glennie Isle, Psy.D. Type of Session: Individual Therapy  Location of Patient: *** Location of Provider: {Location of Service:22491} Type of Contact: Telepsychological Visit via [employer] WebEx   Session Content: Tina Hartman is a 43 y.o. female presenting via [name] for a follow-up appointment to address the previously established treatment goal of decreasing emotional eating. Of note, this provider called Breon at 2:32pm as she did not present for the [facility] appointment. There was no answer and this provider was unable to leave a voicemail as [employer] was full. The e-mail with the secure link was re-sent. As such, today's appointment was initiated *** minutes late.  Today's appointment was a telepsychological visit, as this provider's clinic is seeing a limited number of patients for in-person visits due to COVID-19. Therapeutic services will resume to in-person appointments once deemed appropriate. Traci expressed understanding regarding the rationale for telepsychological services, and provided verbal consent for today's appointment. Prior to proceeding with today's appointment, [name]'s physical location at the time of this appointment was obtained. [name] reported she was at *** and provided the address. In the event of technical difficulties, [name] shared a phone number she could be reached at. [name] and this provider participated in today's telepsychological service. Also, [name] denied anyone else being present in the room or on the WebEx appointment ***.  This provider conducted a brief check-in and verbally administered the PHQ-9 and GAD-7. *** Floreen was receptive to today's session as evidenced by openness to sharing, responsiveness to feedback, and ***.  Mental Status Examination:  Appearance: {Appearance:22431} Behavior: {Behavior:22445} Mood: {Teletherapy  mood:22435} Affect: {Affect:22436} Speech: {Speech:22432} Eye Contact: {Eye Contact:22433} Psychomotor Activity: {Motor Activity:22434} Thought Process: {thought process:22448}  Content/Perceptual Disturbances: {disturbances:22451} Orientation: {Orientation:22437} Cognition/Sensorium: {gbcognition:22449} Insight: {Insight:22446} Judgment: {Insight:22446}  Structured Assessment Results: The Patient Health Questionnaire-9 (PHQ-9) is a self-report measure that assesses symptoms and severity of depression over the course of the last two weeks. [name] obtained a score of *** suggesting {GBPHQ9SEVERITY:21752}. [name] finds the endorsed symptoms to be {gbphq9difficulty:21754}. Little interest or pleasure in doing things ***  Feeling down, depressed, or hopeless ***  Trouble falling or staying asleep, or sleeping too much ***  Feeling tired or having little energy ***  Poor appetite or overeating ***  Feeling bad about yourself --- or that you are a failure or have let yourself or your family down ***  Trouble concentrating on things, such as reading the newspaper or watching television ***  Moving or speaking so slowly that other people could have noticed? Or the opposite --- being so fidgety or restless that you have been moving around a lot more than usual ***  Thoughts that you would be better off dead or hurting yourself in some way ***  PHQ-9 Score ***    The Generalized Anxiety Disorder-7 (GAD-7) is a brief self-report measure that assesses symptoms of anxiety over the course of the last two weeks. [name] obtained a score of *** suggesting {gbgad7severity:21753}. [name] finds the endorsed symptoms to be {gbphq9difficulty:21754}. Feeling nervous, anxious, on edge ***  Not being able to stop or control worrying ***  Worrying too much about different things ***  Trouble relaxing ***  Being so restless that it's hard to sit still ***  Becoming easily annoyed or irritable ***  Feeling afraid as  if something awful might happen ***  GAD-7 Score ***   Interventions:  {Interventions:22172}  DSM-5 Diagnosis: 311 (F32.8) Other Specified Depressive Disorder, Emotional Eating Behaviors  Treatment Goal & Progress: During the initial appointment with this provider, the following treatment goal was established: decrease emotional eating. [name] has demonstrated progress in her goal as evidenced by {gbtxprogress:22839}. [name] also reported {gbtxprogress2:22951}.  Plan: [name] continues to appear able and willing to participate as evidenced by engagement in reciprocal conversation, and asking questions for clarification as appropriate. The next appointment will be scheduled in {gbweeks:21758}, which will be via [employer] WebEx. The next session will focus on reviewing learned skills, and working towards the established treatment goal.***

## 2018-11-10 ENCOUNTER — Ambulatory Visit (INDEPENDENT_AMBULATORY_CARE_PROVIDER_SITE_OTHER): Payer: Self-pay | Admitting: Psychology

## 2018-11-10 ENCOUNTER — Telehealth (INDEPENDENT_AMBULATORY_CARE_PROVIDER_SITE_OTHER): Payer: Self-pay | Admitting: Psychology

## 2018-11-10 ENCOUNTER — Encounter (INDEPENDENT_AMBULATORY_CARE_PROVIDER_SITE_OTHER): Payer: Self-pay

## 2018-11-10 NOTE — Telephone Encounter (Signed)
  Office: [phone]  /  Fax: [phone]  Date of Call: November 10, 2018  Time of Call: 2:32pm Provider: Glennie Isle, PsyD  CONTENT:This provider called [name] to check-in as she did not present for today's Webex appointment at 2:30pm. There was no answer and this provider was unable to leave a voicemail as [employer] was full. Of note, this provider stayed on the [facility] appointment for 7 minutes prior to signing off, which is 2 additional minutes past the clinic's 5 minute grace period policy.    PLAN: This provider will wait for Taylon to call back. If deemed necessary, this provider or the provider's clinic will call Niyanna again in approximately one week.

## 2018-11-17 ENCOUNTER — Ambulatory Visit (INDEPENDENT_AMBULATORY_CARE_PROVIDER_SITE_OTHER): Payer: BC Managed Care – PPO | Admitting: Family Medicine

## 2018-12-05 NOTE — Progress Notes (Signed)
Office: [phone]  /  Fax: [phone]    Date: December 07, 2018   Appointment Start Time: 8:05am Duration: 26 minutes Provider: Glennie Isle, Psy.D. Type of Session: Individual Therapy  Location of Patient: Home  Location of Provider: Provider's Home Type of Contact: Telepsychological Visit via [employer] WebEx   Session Content: Of note, this provider called [name] at 8:02am as she did not present for the [facility] appointment. Tina Hartman forgot about today's appointment, but noted she would be able to join shortly. The e-mail with the secure link was re-sent. As such, today's appointment was initiated 5 minutes late.  Tina Hartman is a 43 y.o. female presenting via [place] for a follow-up appointment to address the previously established treatment goal of decreasing emotional eating. Today's appointment was a telepsychological visit, as it is an option for appointments to reduce exposure to COVID-19. Tina Hartman expressed understanding regarding the rationale for telepsychological services, and provided verbal consent for today's appointment. Prior to proceeding with today's appointment, Tina Hartman's physical location at the time of this appointment was obtained. Tina Hartman reported she was at home and provided the address. In the event of technical difficulties, Tina Hartman shared a phone number she could be reached at. Tina Hartman and this provider participated in today's telepsychological service. Also, Tina Hartman denied anyone else being present in the room or on the WebEx appointment.  This provider conducted a brief check-in and verbally administered the PHQ-9 and GAD-7. Tina Hartman stated she has been focusing on "working and exercising." She discussed having "ups and downs" with the meal plan as it relates to meal planning. She also described fluctuations in appetite since the last appointment with this provider and indicated engaging in emotional eating secondary to feeling annoyed at home. 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 Thus, this provider recommended longer-term therapeutic services due to the aforementioned and discussed options to establish care with a new provider, including Tina Hartman contacting her insurance company for a list of in-network providers, exploring [email or web address], or this provider placing a referral. Tina Hartman provided verbal consent for this provider to place a referral to address ongoing stressors and coping. She was agreeable to meeting with this provider until she is established with a new provider at [place].  Remainder of today's appointment focused on mindfulness. Tina Hartman noted, "I just try to be aware of certain situations and coping with myself." Due to the increased awareness, Tina Hartman indicated an increase in walking and engaging in pleasurable activities. Positive reinforcement was provided. Psychoeducation regarding formal (e.g., setting aside a specific time daily to engage in an exercise) and informal (e.g., cultivating awareness in the present moment and taking a non-judgmental approach while engaging in day-to-day tasks) mindfulness was provided. It was reflected she is engaging in more informal practice based on what she shared. As such, this provider discussed the utilization of YouTube for mindfulness exercises (e.g., videos by Merri Ray) to increase formal mindfulness practice. In addition, Tina Hartman was led through a mindfulness exercise focusing on her breath as an anchor during today's appointment. Following the exercise, she noted she initially heard "everything" in the house, but described being able to bring her awareness back to her breath each time. Tina Hartman provided verbal consent during today's appointment for this provider to send the handout for today's exercise via e-mail. Overall, Tina Hartman was receptive to today's session as evidenced by openness to  sharing, responsiveness to feedback, and willingness to continue engaging  in mindfulness exercises.  Mental Status Examination:  Appearance: neat Behavior: cooperative Mood: euthymic Affect: mood congruent Speech: normal in rate, volume, and tone Eye Contact: appropriate Psychomotor Activity: appropriate Thought Process: linear, logical, and goal directed  Content/Perceptual Disturbances: denies suicidal and homicidal ideation, plan, and intent and no hallucinations, delusions, bizarre thinking or behavior reported or observed Orientation: time, person, place and purpose of appointment Cognition/Sensorium: memory, attention, language, and fund of knowledge intact  Insight: good Judgment: good  Structured Assessment Results: The Patient Health Questionnaire-9 (PHQ-9) is a self-report measure that assesses symptoms and severity of depression over the course of the last two weeks. Tina Hartman obtained a score of 0. Little interest or pleasure in doing things 0  Feeling down, depressed, or hopeless 0  Trouble falling or staying asleep, or sleeping too much 0  Feeling tired or having little energy 0  Poor appetite or overeating 0  Feeling bad about yourself --- or that you are a failure or have let yourself or your family down 0  Trouble concentrating on things, such as reading the newspaper or watching television 0  Moving or speaking so slowly that other people could have noticed? Or the opposite --- being so fidgety or restless that you have been moving around a lot more than usual 0  Thoughts that you would be better off dead or hurting yourself in some way 0  PHQ-9 Score 0    The Generalized Anxiety Disorder-7 (GAD-7) is a brief self-report measure that assesses symptoms of anxiety over the course of the last two weeks. Tina Hartman obtained a score of 1 suggesting minimal anxiety. Tina Hartman finds the endorsed symptoms to be somewhat difficult. Feeling nervous, anxious, on edge 0  Not being able to  stop or control worrying 0  Worrying too much about different things 0  Trouble relaxing 0  Being so restless that it's hard to sit still 0  Becoming easily annoyed or irritable 1  Feeling afraid as if something awful might happen 0  GAD-7 Score 1   Interventions:  Conducted a brief chart review Verbal administration of PHQ-9 and GAD-7 for symptom monitoring Provided empathic reflections and validation Reviewed content from the previous session Psychoeducation provided regarding mindfulness Engaged patient in a mindfulness exercise Discussed termination planning Discussed option for a referral for longer-term therapeutic services Provided positive reinforcement Employed supportive psychotherapy interventions to facilitate reduced distress, and to improve coping skills with identified stressors Employed acceptance and commitment interventions to emphasize mindfulness and acceptance without struggle  DSM-5 Diagnosis: 311 (F32.8) Other Specified Depressive Disorder, Emotional Eating Behaviors  Treatment Goal & Progress: During the initial appointment with this provider, the following treatment goal was established: decrease emotional eating. Tina Hartman has demonstrated progress in her goal as evidenced by increased awareness of hunger patterns and triggers for emotional eating. Tina Hartman also demonstrates willingness to engage in mindfulness exercises.  Plan: Tina Hartman continues to appear able and willing to participate as evidenced by engagement in reciprocal conversation, and asking questions for clarification as appropriate. The next appointment will be scheduled in three weeks, which will be via [employer]. The next session will focus further on mindfulness. A referral will be placed for [facility].

## 2018-12-06 ENCOUNTER — Other Ambulatory Visit (INDEPENDENT_AMBULATORY_CARE_PROVIDER_SITE_OTHER): Payer: Self-pay | Admitting: Family Medicine

## 2018-12-06 DIAGNOSIS — E559 Vitamin D deficiency, unspecified: Secondary | ICD-10-CM

## 2018-12-07 ENCOUNTER — Other Ambulatory Visit: Payer: Self-pay

## 2018-12-07 ENCOUNTER — Ambulatory Visit (INDEPENDENT_AMBULATORY_CARE_PROVIDER_SITE_OTHER): Payer: BC Managed Care – PPO | Admitting: Psychology

## 2018-12-07 ENCOUNTER — Encounter (INDEPENDENT_AMBULATORY_CARE_PROVIDER_SITE_OTHER): Payer: Self-pay

## 2018-12-07 DIAGNOSIS — F3289 Other specified depressive episodes: Secondary | ICD-10-CM | POA: Diagnosis not present

## 2018-12-08 ENCOUNTER — Ambulatory Visit (INDEPENDENT_AMBULATORY_CARE_PROVIDER_SITE_OTHER): Payer: BC Managed Care – PPO | Admitting: Family Medicine

## 2018-12-08 ENCOUNTER — Encounter (INDEPENDENT_AMBULATORY_CARE_PROVIDER_SITE_OTHER): Payer: Self-pay | Admitting: Family Medicine

## 2018-12-08 ENCOUNTER — Other Ambulatory Visit: Payer: Self-pay

## 2018-12-08 ENCOUNTER — Other Ambulatory Visit (INDEPENDENT_AMBULATORY_CARE_PROVIDER_SITE_OTHER): Payer: Self-pay | Admitting: Family Medicine

## 2018-12-08 VITALS — BP 113/80 | HR 86 | Temp 98.1°F | Ht 68.0 in | Wt 272.0 lb

## 2018-12-08 DIAGNOSIS — E559 Vitamin D deficiency, unspecified: Secondary | ICD-10-CM

## 2018-12-08 DIAGNOSIS — E8881 Metabolic syndrome: Secondary | ICD-10-CM | POA: Diagnosis not present

## 2018-12-08 DIAGNOSIS — E7849 Other hyperlipidemia: Secondary | ICD-10-CM | POA: Diagnosis not present

## 2018-12-08 DIAGNOSIS — E88819 Insulin resistance, unspecified: Secondary | ICD-10-CM

## 2018-12-08 DIAGNOSIS — Z9189 Other specified personal risk factors, not elsewhere classified: Secondary | ICD-10-CM

## 2018-12-08 DIAGNOSIS — R7309 Other abnormal glucose: Secondary | ICD-10-CM | POA: Diagnosis not present

## 2018-12-08 DIAGNOSIS — Z6841 Body Mass Index (BMI) 40.0 and over, adult: Secondary | ICD-10-CM

## 2018-12-08 DIAGNOSIS — R7989 Other specified abnormal findings of blood chemistry: Secondary | ICD-10-CM | POA: Diagnosis not present

## 2018-12-08 MED ORDER — METFORMIN HCL 500 MG PO TABS: 500.0000 mg | ORAL_TABLET | Freq: Every day | ORAL | 0 refills | Status: DC

## 2018-12-08 MED ORDER — VITAMIN D (ERGOCALCIFEROL) 1.25 MG (50000 UNIT) PO CAPS: 50000.0000 [IU] | ORAL_CAPSULE | ORAL | 0 refills | Status: DC

## 2018-12-09 LAB — COMPREHENSIVE METABOLIC PANEL
ALT: 9 IU/L (ref 0–32)
AST: 10 IU/L (ref 0–40)
Albumin/Globulin Ratio: 1.2 (ref 1.2–2.2)
Albumin: 3.8 g/dL (ref 3.8–4.8)
Alkaline Phosphatase: 77 IU/L (ref 39–117)
BUN/Creatinine Ratio: 10 (ref 9–23)
BUN: 8 mg/dL (ref 6–24)
Bilirubin Total: 0.2 mg/dL (ref 0.0–1.2)
CO2: 25 mmol/L (ref 20–29)
Calcium: 8.8 mg/dL (ref 8.7–10.2)
Chloride: 102 mmol/L (ref 96–106)
Creatinine, Ser: 0.79 mg/dL (ref 0.57–1.00)
GFR calc Af Amer: 107 mL/min/{1.73_m2} (ref >59)
GFR calc non Af Amer: 93 mL/min/{1.73_m2} (ref >59)
Globulin, Total: 3.2 g/dL (ref 1.5–4.5)
Glucose: 88 mg/dL (ref 65–99)
Potassium: 4.4 mmol/L (ref 3.5–5.2)
Sodium: 139 mmol/L (ref 134–144)
Total Protein: 7 g/dL (ref 6.0–8.5)

## 2018-12-09 LAB — HEMOGLOBIN A1C
Est. average glucose Bld gHb Est-mCnc: 117 mg/dL
Hgb A1c MFr Bld: 5.7 % — ABNORMAL HIGH (ref 4.8–5.6)

## 2018-12-09 LAB — INSULIN, RANDOM: INSULIN: 17.2 u[IU]/mL (ref 2.6–24.9)

## 2018-12-09 LAB — LIPID PANEL WITH LDL/HDL RATIO
Cholesterol, Total: 155 mg/dL (ref 100–199)
HDL: 39 mg/dL — ABNORMAL LOW (ref >39)
LDL Chol Calc (NIH): 106 mg/dL — ABNORMAL HIGH (ref 0–99)
LDL/HDL Ratio: 2.7 ratio (ref 0.0–3.2)
Triglycerides: 47 mg/dL (ref 0–149)
VLDL Cholesterol Cal: 10 mg/dL (ref 5–40)

## 2018-12-09 LAB — T4, FREE: Free T4: 0.89 ng/dL (ref 0.82–1.77)

## 2018-12-09 LAB — T3: T3, Total: 120 ng/dL (ref 71–180)

## 2018-12-09 LAB — VITAMIN D 25 HYDROXY (VIT D DEFICIENCY, FRACTURES): Vit D, 25-Hydroxy: 30.9 ng/mL (ref 30.0–100.0)

## 2018-12-09 LAB — TSH: TSH: 2.7 u[IU]/mL (ref 0.450–4.500)

## 2018-12-09 LAB — VITAMIN B12: Vitamin B-12: 263 pg/mL (ref 232–1245)

## 2018-12-14 NOTE — Progress Notes (Signed)
Office: [phone]  /  Fax: [phone]   HPI:   Chief Complaint: OBESITY Tina Hartman is here to discuss her progress with her obesity treatment plan. She is on the keep a food journal with 1500 calories and 85+ grams of protein daily and is following her eating plan approximately 25 % of the time. She states she is walking for 45-60 minutes 3 times per week. Tina Hartman has not been journaling more and has gained 4 lbs since her last visit 6 weeks ago. She is frustrated that doing portion control and making smarter food choices has not helped her lose weight. She is hiring a Systems analyst to help her increase exercise.  Her weight is 272 lb (123.4 kg) today and has gained 4 lbs since her last visit. She has lost 7 lbs since starting treatment with Tina Hartman.  Insulin Resistance Tina Hartman has a diagnosis of insulin resistance based on her elevated fasting insulin level >5. Although Tina Hartman's blood glucose readings are still under good control, insulin resistance puts her at greater risk of metabolic syndrome and diabetes. She is taking metformin currently and denies nausea or vomiting. She continues to work on diet and exercise to decrease risk of diabetes. She is due for labs.  Vitamin D Deficiency Tina Hartman has a diagnosis of vitamin D deficiency. She is currently taking prescription Vit D, but level is not yet at goal. She denies nausea, vomiting or muscle weakness. She is due for labs.  Elevated TSH Tina Hartman's last TSH was elevated. She is not on levothyroxine. She denies hot or cold intolerance.  Hyperlipidemia Tina Hartman has hyperlipidemia and is attempting to control her cholesterol levels with intensive lifestyle modification including a low saturated fat diet, exercise and weight loss. She denies any chest pain, claudication or myalgias. She is due for labs.  At risk for cardiovascular disease Tina Hartman is at a higher than average risk for cardiovascular disease due to obesity and hyperlipidemia. She currently  denies any chest pain.  ASSESSMENT AND PLAN:  Vitamin D deficiency - Plan: VITAMIN D 25 Hydroxy (Vit-D Deficiency, Fractures), Vitamin D, Ergocalciferol, (DRISDOL) 1.25 MG (50000 UT) CAPS capsule, Vitamin B12  Insulin resistance - Plan: Comprehensive metabolic panel, Hemoglobin A1c, Insulin, random, metFORMIN (GLUCOPHAGE) 500 MG tablet  Other hyperlipidemia - Plan: Lipid Panel With LDL/HDL Ratio  Elevated TSH - Plan: T3, T4, free, TSH  At risk for heart disease  Class 3 severe obesity with serious comorbidity and body mass index (BMI) of 40.0 to 44.9 in adult, unspecified obesity type (HCC)  PLAN:  Insulin Resistance Essense will continue to work on weight loss, exercise, and decreasing simple carbohydrates in her diet to help decrease the risk of diabetes. We dicussed metformin including benefits and risks. She was informed that eating too many simple carbohydrates or too many calories at one sitting increases the likelihood of GI side effects. Tina Hartman agrees to continue taking metformin 500 mg PO q AM #30 and we will refill for 1 month. We will check labs today. Tina Hartman agrees to follow up with our clinic in 2 to 3 weeks as directed to monitor her progress.  Vitamin D Deficiency Tina Hartman was informed that low vitamin D levels contributes to fatigue and are associated with obesity, breast, and colon cancer. Tina Hartman agrees to continue taking prescription Vit D 50,000 IU every week #4 and we will refill for 1 month. She will follow up for routine testing of vitamin D, at least 2-3 times per year. She was informed of the risk of  over-replacement of vitamin D and agrees to not increase her dose unless she discusses this with us first. We will check labs today. Tina Hartman agrees to follow up with our clinic in 2 to 3 weeks.  Elevated TSH Tina Hartman was informed of the importance of good thyroid control to help with weight loss efforts. She was also informed that supertheraputic thyroid levels are dangerous and will  not improve weight loss results. We will check labs today and Tina Hartman will follow up with our clinic in 2 to 3 weeks.  Hyperlipidemia Tina Hartman was informed of the American Heart Association Guidelines emphasizing intensive lifestyle modifications as the first line treatment for hyperlipidemia. We discussed many lifestyle modifications today in depth, and Tina Hartman will continue to work on decreasing saturated fats such as fatty red meat, butter and many fried foods. She will also increase vegetables and lean protein in her diet and continue to work on diet, exercise, and weight loss efforts. We will check labs today and Tina Hartman will follow up with our clinic in 2 to 3 weeks.  Cardiovascular risk counseling Tina Hartman was given extended (15 minutes) coronary artery disease prevention counseling today. She is 43 y.o. female and has risk factors for heart disease including obesity and hyperlipidemia. We discussed intensive lifestyle modifications today with an emphasis on specific weight loss instructions and strategies. Pt was also informed of the importance of increasing exercise and decreasing saturated fats to help prevent heart disease.  Obesity Tina Hartman is currently in the action stage of change. As such, her goal is to continue with weight loss efforts She has agreed to keep a food journal with 1500 calories and 85+ grams of protein daily Tina Hartman was strongly encouraged to go back to journaling. Tina Hartman has been instructed to work up to a goal of 150 minutes of combined cardio and strengthening exercise per week for weight loss and overall health benefits. We discussed the following Behavioral Modification Strategies today: increasing lean protein intake, decreasing simple carbohydrates, and keep a strict food journal    Tina Hartman has agreed to follow up with our clinic in 2 to 3 weeks. She was informed of the importance of frequent follow up visits to maximize her success with intensive lifestyle modifications for her  multiple health conditions.  ALLERGIES: Allergies  Allergen Reactions   Latex     MEDICATIONS: Current Outpatient Medications on File Prior to Visit  Medication Sig Dispense Refill   OVER THE COUNTER MEDICATION Take 1 tablet by mouth daily. Sunny mood vitamin     spironolactone (ALDACTONE) 100 MG tablet Take 100 mg by mouth daily.     No current facility-administered medications on file prior to visit.     PAST MEDICAL HISTORY: Past Medical History:  Diagnosis Date   Acne    Anxiety    Back pain    CFS (chronic fatigue syndrome)    Constipation    Depression    Dyspnea    GERD (gastroesophageal reflux disease)    HLD (hyperlipidemia)    HTN (hypertension)    Infertility, female    Irregular menstruation    Joint pain    Lactose intolerance    Lower extremity edema    Obesity    PCOS (polycystic ovarian syndrome)    Sleep apnea    Vitamin D deficiency     PAST SURGICAL HISTORY: Past Surgical History:  Procedure Laterality Date   BLADDER SURGERY     polyp removal   EYE SURGERY Right     SOCIAL  HISTORY: Social History   Tobacco Use   Smoking status: Never Smoker   Smokeless tobacco: Never Used  Substance Use Topics   Alcohol use: Yes   Drug use: No    FAMILY HISTORY: Family History  Problem Relation Age of Onset   Anxiety disorder Mother    Sleep apnea Mother    Hyperlipidemia Father     ROS: Review of Systems  Constitutional: Negative for weight loss.  Cardiovascular: Negative for chest pain and claudication.  Gastrointestinal: Negative for nausea and vomiting.  Musculoskeletal: Negative for myalgias.       Negative muscle weakness  Endo/Heme/Allergies:       Negative hot/cold intolerance    PHYSICAL EXAM: Blood pressure 113/80, pulse 86, temperature 98.1 F (36.7 C), temperature source Oral, height 5\' 8"  (1.727 m), weight 272 lb (123.4 kg), last menstrual period 11/16/2018, SpO2 99 %. Body mass index is  41.36 kg/m. Physical Exam Vitals signs reviewed.  Constitutional:      Appearance: Normal appearance. She is obese.  Cardiovascular:     Rate and Rhythm: Normal rate.     Pulses: Normal pulses.  Pulmonary:     Effort: Pulmonary effort is normal.     Breath sounds: Normal breath sounds.  Musculoskeletal: Normal range of motion.  Skin:    General: Skin is warm and dry.  Neurological:     Mental Status: She is alert and oriented to person, place, and time.  Psychiatric:        Mood and Affect: Mood normal.        Behavior: Behavior normal.     RECENT LABS AND TESTS: BMET    Component Value Date/Time   NA 139 12/08/2018 0840   K 4.4 12/08/2018 0840   CL 102 12/08/2018 0840   CO2 25 12/08/2018 0840   GLUCOSE 88 12/08/2018 0840   GLUCOSE 108 (H) 07/23/2009 1338   BUN 8 12/08/2018 0840   CREATININE 0.79 12/08/2018 0840   CALCIUM 8.8 12/08/2018 0840   GFRNONAA 93 12/08/2018 0840   GFRAA 107 12/08/2018 0840   Lab Results  Component Value Date   HGBA1C 5.7 (H) 12/08/2018   HGBA1C 5.5 08/02/2018   Lab Results  Component Value Date   INSULIN 17.2 12/08/2018   INSULIN 14.4 08/02/2018   CBC    Component Value Date/Time   WBC 4.3 08/02/2018 0000   WBC 7.0 07/23/2009 1338   WBC 6.3 12/19/2007 0013   RBC 4.15 08/02/2018 0000   RBC 3.91 07/23/2009 1338   RBC 4.40 12/19/2007 0013   HGB 12.9 08/02/2018 0000   HGB 12.6 07/23/2009 1338   HCT 37.2 08/02/2018 0000   HCT 36.9 07/23/2009 1338   PLT 284 07/23/2009 1338   MCV 90 08/02/2018 0000   MCV 94.3 07/23/2009 1338   MCH 31.1 08/02/2018 0000   MCH 32.1 07/23/2009 1338   MCHC 34.7 08/02/2018 0000   MCHC 34.1 07/23/2009 1338   MCHC 33.9 12/19/2007 0013   RDW 13.6 08/02/2018 0000   RDW 15.0 (H) 07/23/2009 1338   LYMPHSABS 1.3 08/02/2018 0000   LYMPHSABS 1.9 07/23/2009 1338   MONOABS 0.5 07/23/2009 1338   EOSABS 0.2 08/02/2018 0000   BASOSABS 0.1 08/02/2018 0000   BASOSABS 0.0 07/23/2009 1338   Iron/TIBC/Ferritin/  %Sat No results found for: IRON, TIBC, FERRITIN, IRONPCTSAT Lipid Panel     Component Value Date/Time   CHOL 155 12/08/2018 0840   TRIG 47 12/08/2018 0840   HDL 39 (L) 12/08/2018 0840  LDLCALC 106 (H) 12/08/2018 0840   Hepatic Function Panel     Component Value Date/Time   PROT 7.0 12/08/2018 0840   ALBUMIN 3.8 12/08/2018 0840   AST 10 12/08/2018 0840   ALT 9 12/08/2018 0840   ALKPHOS 77 12/08/2018 0840   BILITOT 0.2 12/08/2018 0840      Component Value Date/Time   TSH 2.700 12/08/2018 0840   TSH 5.120 (H) 08/02/2018 0000      OBESITY BEHAVIORAL INTERVENTION VISIT  Today's visit was # 6   Starting weight: 279 lbs Starting date: 08/02/2018 Today's weight : 272 lbs Today's date: 12/08/2018 Total lbs lost to date: 7    ASK: We discussed the diagnosis of obesity with Kennieth Rad today and Melina agreed to give Tina Hartman permission to discuss obesity behavioral modification therapy today.  ASSESS: Arian has the diagnosis of obesity and her BMI today is 41.37 Caylan is in the action stage of change   ADVISE: Jaylan was educated on the multiple health risks of obesity as well as the benefit of weight loss to improve her health. She was advised of the need for long term treatment and the importance of lifestyle modifications to improve her current health and to decrease her risk of future health problems.  AGREE: Multiple dietary modification options and treatment options were discussed and  [place]lee agreed to follow the recommendations documented in the above note.  ARRANGE: Myrl was educated on the importance of frequent visits to treat obesity as outlined per CMS and USPSTF guidelines and agreed to schedule her next follow up appointment today.  I, Burt Knack, am acting as transcriptionist for Quillian Quince, MD I have reviewed the above documentation for accuracy and completeness, and I agree with the above. -Quillian Quince, MD

## 2018-12-15 ENCOUNTER — Other Ambulatory Visit (INDEPENDENT_AMBULATORY_CARE_PROVIDER_SITE_OTHER): Payer: Self-pay | Admitting: Family Medicine

## 2018-12-15 DIAGNOSIS — E8881 Metabolic syndrome: Secondary | ICD-10-CM

## 2018-12-21 NOTE — Progress Notes (Signed)
Office: [phone]  /  Fax: [phone]    Date: December 28, 2018   Appointment Start Time: 8:04am Duration: 27 minutes Provider: Lawerance Cruel, Psy.D. Type of Session: Individual Therapy  Location of Patient: Parked in car Location of Provider: Provider's Home Type of Contact: Telepsychological Visit via [employer]   Session Content: Of note, this provider called Tina Hartman at 8:02am as she did not present for the WebEx appointment. Tina Hartman shared she had to see a patient this morning, but was "pulling over" to connect for today's appointment. The e-mail with the secure link was re-sent. As such, today's appointment was initiated 4 minutes late.  Tina Hartman is a 43 y.o. female presenting via [employer] WebEx for a follow-up appointment to address the previously established treatment goal of decreasing emotional eating. Today's appointment was a telepsychological visit, as it is an option for appointments to reduce exposure to COVID-19. Tina Hartman expressed understanding regarding the rationale for telepsychological services, and provided verbal consent for today's appointment. Prior to proceeding with today's appointment, Tina Hartman's physical location at the time of this appointment was obtained. In the event of technical difficulties, Tina Hartman shared a phone number she could be reached at. Tina Hartman and this provider participated in today's telepsychological service. Also, Tina Hartman denied anyone else being present in the car or on the WebEx appointment.  This provider conducted a brief check-in and verbally administered the PHQ-9 and GAD-7. Tina Hartman shared she has been focusing on working and recently spent time with friends. Regarding eating, Tina Hartman noted, "I can't figure this eating out. I'm over it." She believes "no matter" what she eats, "it stays on [her]." This was further explored and associated thoughts and feelings were processed. She described having difficulty eating frequently during her work day and noted she does not  like stopping at [employer]. Thus, this provider recommended taking snacks and meals with her to work. Obstacles/barriers that may prevent her from doing the aforementioned were explored. She acknowledged long work days may pose a challenge, but noted a plan "to cut back" her current work schedule. Her typical work day was explored and this provider engaged her in problem solving. Tina Hartman reported willingness to prepare breakfast to eat on her way to work. Notably, it was identified Tina Hartman is not eating enough calories and protein. Therefore, this provider set a goal with Tina Hartman to focus on protein intake during each meal/snack. Tina Hartman was receptive to today's session as evidenced by openness to sharing, responsiveness to feedback, and willingness to implement discussed strategies. Tina Hartman noted, "I can't quit!" She further shared, "I'm glad I talked to you today because I was going to tell Tina Hartman I can't anymore."  Mental Status Examination:  Appearance: neat Behavior: cooperative Mood: euthymic Affect: mood congruent Speech: normal in rate, volume, and tone Eye Contact: intermittent  Psychomotor Activity: appropriate Thought Process: linear, logical, and goal directed  Content/Perceptual Disturbances: denies suicidal and homicidal ideation, plan, and intent and no hallucinations, delusions, bizarre thinking or behavior reported or observed Orientation: time, person, place and purpose of appointment Cognition/Sensorium: memory, attention, language, and fund of knowledge intact  Insight: good Judgment: good  Structured Assessment Results: The Patient Health Questionnaire-9 (PHQ-9) is a self-report measure that assesses symptoms and severity of depression over the course of the last two weeks. Tina Hartman obtained a score of 0. Little interest or pleasure in doing things 0  Feeling down, depressed, or hopeless 0  Trouble falling or staying asleep, or sleeping too much 0  Feeling tired or  having little energy 0  Poor appetite or overeating 0  Feeling bad about yourself --- or that you are a failure or have let yourself or your family down 0  Trouble concentrating on things, such as reading the newspaper or watching television 0  Moving or speaking so slowly that other people could have noticed? Or the opposite --- being so fidgety or restless that you have been moving around a lot more than usual 0  Thoughts that you would be better off dead or hurting yourself in some way 0  PHQ-9 Score 0    The Generalized Anxiety Disorder-7 (GAD-7) is a brief self-report measure that assesses symptoms of anxiety over the course of the last two weeks. Tina Hartman obtained a score of 0. Feeling nervous, anxious, on edge 0  Not being able to stop or control worrying 0  Worrying too much about different things 0  Trouble relaxing 0  Being so restless that it's hard to sit still 0  Becoming easily annoyed or irritable 0  Feeling afraid as if something awful might happen 0  GAD-7 Score 0   Interventions:  Conducted a brief chart review Verbal administration of PHQ-9 and GAD-7 for symptom monitoring Provided empathic reflections and validation Engaged patient in problem solving Processed thoughts and feelings Employed motivational interviewing skills to assess patient's willingness/desire to adhere to recommended medical treatments and assignments Employed supportive psychotherapy interventions to facilitate reduced distress, and to improve coping skills with identified stressors  DSM-5 Diagnosis: 311 (F32.8) Other Specified Depressive Disorder, Emotional Eating Behaviors  Treatment Goal & Progress: During the initial appointment with this provider, the following treatment goal was established: decrease emotional eating. Tina Hartman has demonstrated progress in her goal as evidenced by increased awareness of hunger patterns and triggers for emotional eating. Tina Hartman also continues to demonstrate  willingness to engage in learned skill(s).  Plan: Tina Hartman continues to appear able and willing to participate as evidenced by engagement in reciprocal conversation, and asking questions for clarification as appropriate. The next appointment will be scheduled in 2-3 weeks, which will be via [employer]. She is also scheduled for an initial appointment with a new provider on January 09, 2019. The next session will focus further on mindfulness, as it was not discussed today due to Tina Hartman's presenting concerns.

## 2018-12-23 DIAGNOSIS — Z23 Encounter for immunization: Secondary | ICD-10-CM | POA: Diagnosis not present

## 2018-12-28 ENCOUNTER — Other Ambulatory Visit: Payer: Self-pay

## 2018-12-28 ENCOUNTER — Ambulatory Visit (INDEPENDENT_AMBULATORY_CARE_PROVIDER_SITE_OTHER): Payer: BC Managed Care – PPO | Admitting: Psychology

## 2018-12-28 DIAGNOSIS — F3289 Other specified depressive episodes: Secondary | ICD-10-CM

## 2018-12-29 ENCOUNTER — Other Ambulatory Visit: Payer: Self-pay

## 2018-12-29 ENCOUNTER — Ambulatory Visit (INDEPENDENT_AMBULATORY_CARE_PROVIDER_SITE_OTHER): Payer: BC Managed Care – PPO | Admitting: Family Medicine

## 2018-12-29 ENCOUNTER — Encounter (INDEPENDENT_AMBULATORY_CARE_PROVIDER_SITE_OTHER): Payer: Self-pay | Admitting: Family Medicine

## 2018-12-29 VITALS — BP 105/74 | HR 92 | Temp 98.2°F | Ht 68.0 in | Wt 272.0 lb

## 2018-12-29 DIAGNOSIS — R7303 Prediabetes: Secondary | ICD-10-CM

## 2018-12-29 DIAGNOSIS — Z9189 Other specified personal risk factors, not elsewhere classified: Secondary | ICD-10-CM | POA: Diagnosis not present

## 2018-12-29 DIAGNOSIS — E559 Vitamin D deficiency, unspecified: Secondary | ICD-10-CM

## 2018-12-29 DIAGNOSIS — Z6841 Body Mass Index (BMI) 40.0 and over, adult: Secondary | ICD-10-CM

## 2018-12-29 MED ORDER — INSULIN PEN NEEDLE 32G X 4 MM MISC: 0 refills | Status: DC

## 2018-12-29 MED ORDER — VICTOZA 18 MG/3ML ~~LOC~~ SOPN: 0.6000 mg | PEN_INJECTOR | SUBCUTANEOUS | 0 refills | Status: DC

## 2018-12-29 MED ORDER — VITAMIN D (ERGOCALCIFEROL) 1.25 MG (50000 UNIT) PO CAPS: 50000.0000 [IU] | ORAL_CAPSULE | ORAL | 0 refills | Status: DC

## 2018-12-29 NOTE — Progress Notes (Signed)
Office: [phone]  /  Fax: [phone]   HPI:   Chief Complaint: OBESITY Tina Hartman is here to discuss her progress with her obesity treatment plan. She is on the keep a food journal with 1500 calories and 85+ grams of protein daily and is following her eating plan approximately 30 % of the time. She states she is exercising with a personal trainer, and walking for 30 minutes 2-4 times per week. Tina Hartman has done well maintaining her weight, but she is frustrated that she seems to have stalled. Her hunger has increased and she struggles to keep her calories at goal.  Her weight is 272 lb (123.4 kg) today and has not lost weight since her last visit. She has lost 7 lbs since starting treatment with [name].  Pre-Diabetes Tina Hartman has a diagnosis of pre-diabetes based on her elevated Hgb A1c and was informed this puts her at greater risk of developing diabetes. Her A1c has worsened to 5.7, and she notes polyphagia. She is taking metformin currently and continues to work on diet and exercise to decrease risk of diabetes.   At risk for diabetes Tina Hartman is at higher than average risk for developing diabetes due to her obesity and pre-diabetes. She currently denies polyuria or polydipsia.  Vitamin D Deficiency Tina Hartman has a diagnosis of vitamin D deficiency. She is currently taking prescription Vit D. Her level is slowly improving. Labs were reviewed today, and level is not yet at goal. She denies nausea, vomiting or muscle weakness.  ASSESSMENT AND PLAN:  Prediabetes - Plan: liraglutide (VICTOZA) 18 MG/3ML SOPN, Insulin Pen Needle 32G X 4 MM MISC  Vitamin D deficiency - Plan: Vitamin D, Ergocalciferol, (DRISDOL) 1.25 MG (50000 UT) CAPS capsule  At risk for diabetes mellitus  Class 3 severe obesity with serious comorbidity and body mass index (BMI) of 40.0 to 44.9 in adult, unspecified obesity type (HCC)  PLAN:  Pre-Diabetes Tina Hartman will continue to work on weight loss, exercise, and decreasing simple  carbohydrates in her diet to help decrease the risk of diabetes. We dicussed metformin including benefits and risks. She was informed that eating too many simple carbohydrates or too many calories at one sitting increases the likelihood of GI side effects. Tina Hartman agrees to start Victoza 0.6 mg SubQ q AM #1 pen with no refills, and nano needles #100 with no refills; She is to discontinue metformin. Tina Hartman agrees to follow up with our clinic in 2 weeks as directed to monitor her progress.  Diabetes risk counseling Tina Hartman was given extended (30 minutes) diabetes prevention counseling today. She is 43 y.o. female and has risk factors for diabetes including obesity and pre-diabetes. We discussed intensive lifestyle modifications today with an emphasis on weight loss as well as increasing exercise and decreasing simple carbohydrates in her diet.  Vitamin D Deficiency Tina Hartman was informed that low vitamin D levels contributes to fatigue and are associated with obesity, breast, and colon cancer. Tina Hartman agrees to continue taking prescription Vit D 50,000 IU every week #4 and we will refill for 1 month. She will follow up for routine testing of vitamin D, at least 2-3 times per year. She was informed of the risk of over-replacement of vitamin D and agrees to not increase her dose unless she discusses this with [name] first. Tina Hartman agrees to follow up with our clinic in 2 weeks.  Obesity Tina Hartman is currently in the action stage of change. As such, her goal is to continue with weight loss efforts She has agreed to  keep a food journal with 1500 calories and 85+ grams of protein daily Tina Hartman has been instructed to work up to a goal of 150 minutes of combined cardio and strengthening exercise per week for weight loss and overall health benefits. We discussed the following Behavioral Modification Strategies today: increasing lean protein intake and emotional eating strategies   Tina Hartman has agreed to follow up with our clinic in 2  weeks with Tina Hartman. She was informed of the importance of frequent follow up visits to maximize her success with intensive lifestyle modifications for her multiple health conditions.  ALLERGIES: Allergies  Allergen Reactions  . Latex     MEDICATIONS: Current Outpatient Medications on File Prior to Visit  Medication Sig Dispense Refill  . metFORMIN (GLUCOPHAGE) 500 MG tablet Take 1 tablet (500 mg total) by mouth daily with breakfast. 30 tablet 0  . OVER THE COUNTER MEDICATION Take 1 tablet by mouth daily. Sunny mood vitamin    . spironolactone (ALDACTONE) 100 MG tablet Take 100 mg by mouth daily.     No current facility-administered medications on file prior to visit.     PAST MEDICAL HISTORY: Past Medical History:  Diagnosis Date  . Acne   . Anxiety   . Back pain   . CFS (chronic fatigue syndrome)   . Constipation   . Depression   . Dyspnea   . GERD (gastroesophageal reflux disease)   . HLD (hyperlipidemia)   . HTN (hypertension)   . Infertility, female   . Irregular menstruation   . Joint pain   . Lactose intolerance   . Lower extremity edema   . Obesity   . PCOS (polycystic ovarian syndrome)   . Sleep apnea   . Vitamin D deficiency     PAST SURGICAL HISTORY: Past Surgical History:  Procedure Laterality Date  . BLADDER SURGERY     polyp removal  . EYE SURGERY Right     SOCIAL HISTORY: Social History   Tobacco Use  . Smoking status: Never Smoker  . Smokeless tobacco: Never Used  Substance Use Topics  . Alcohol use: Yes  . Drug use: No    FAMILY HISTORY: Family History  Problem Relation Age of Onset  . Anxiety disorder Mother   . Sleep apnea Mother   . Hyperlipidemia Father     ROS: Review of Systems  Constitutional: Negative for weight loss.  Gastrointestinal: Negative for nausea and vomiting.  Genitourinary: Negative for frequency.  Musculoskeletal:       Negative muscle weakness  Endo/Heme/Allergies: Negative for polydipsia.        Positive polyphagia    PHYSICAL EXAM: Blood pressure 105/74, pulse 92, temperature 98.2 F (36.8 C), temperature source Oral, height  (1.727 m), weight 272 lb (123.4 kg), last menstrual period 12/14/2018, SpO2 99 %. Body mass index is 41.36 kg/m. Physical Exam Vitals signs reviewed.  Constitutional:      Appearance: Normal appearance. She is obese.  Cardiovascular:     Rate and Rhythm: Normal rate.     Pulses: Normal pulses.  Pulmonary:     Effort: Pulmonary effort is normal.     Breath sounds: Normal breath sounds.  Musculoskeletal: Normal range of motion.  Skin:    General: Skin is warm and dry.  Neurological:     Mental Status: She is alert and oriented to person, place, and time.  Psychiatric:        Mood and Affect: Mood normal.        Behavior:  Behavior normal.     RECENT LABS AND TESTS: BMET    Component Value Date/Time   NA 139 12/08/2018 0840   K 4.4 12/08/2018 0840   CL 102 12/08/2018 0840   CO2 25 12/08/2018 0840   GLUCOSE 88 12/08/2018 0840   GLUCOSE 108 (H) 07/23/2009 1338   BUN 8 12/08/2018 0840   CREATININE 0.79 12/08/2018 0840   CALCIUM 8.8 12/08/2018 0840   GFRNONAA 93 12/08/2018 0840   GFRAA 107 12/08/2018 0840   Lab Results  Component Value Date   HGBA1C 5.7 (H) 12/08/2018   HGBA1C 5.5 08/02/2018   Lab Results  Component Value Date   INSULIN 17.2 12/08/2018   INSULIN 14.4 08/02/2018   CBC    Component Value Date/Time   WBC 4.3 08/02/2018 0000   WBC 7.0 07/23/2009 1338   WBC 6.3 12/19/2007 0013   RBC 4.15 08/02/2018 0000   RBC 3.91 07/23/2009 1338   RBC 4.40 12/19/2007 0013   HGB 12.9 08/02/2018 0000   HGB 12.6 07/23/2009 1338   HCT 37.2 08/02/2018 0000   HCT 36.9 07/23/2009 1338   PLT 284 07/23/2009 1338   MCV 90 08/02/2018 0000   MCV 94.3 07/23/2009 1338   MCH 31.1 08/02/2018 0000   MCH 32.1 07/23/2009 1338   MCHC 34.7 08/02/2018 0000   MCHC 34.1 07/23/2009 1338   MCHC 33.9 12/19/2007 0013   RDW 13.6 08/02/2018 0000    RDW 15.0 (H) 07/23/2009 1338   LYMPHSABS 1.3 08/02/2018 0000   LYMPHSABS 1.9 07/23/2009 1338   MONOABS 0.5 07/23/2009 1338   EOSABS 0.2 08/02/2018 0000   BASOSABS 0.1 08/02/2018 0000   BASOSABS 0.0 07/23/2009 1338   Iron/TIBC/Ferritin/ %Sat No results found for: IRON, TIBC, FERRITIN, IRONPCTSAT Lipid Panel     Component Value Date/Time   CHOL 155 12/08/2018 0840   TRIG 47 12/08/2018 0840   HDL 39 (L) 12/08/2018 0840   LDLCALC 106 (H) 12/08/2018 0840   Hepatic Function Panel     Component Value Date/Time   PROT 7.0 12/08/2018 0840   ALBUMIN 3.8 12/08/2018 0840   AST 10 12/08/2018 0840   ALT 9 12/08/2018 0840   ALKPHOS 77 12/08/2018 0840   BILITOT 0.2 12/08/2018 0840      Component Value Date/Time   TSH 2.700 12/08/2018 0840   TSH 5.120 (H) 08/02/2018 0000      OBESITY BEHAVIORAL INTERVENTION VISIT  Today's visit was # 7   Starting weight: 279 lbs Starting date: 08/02/2018 Today's weight : 272 lbs Today's date: 12/29/2018 Total lbs lost to date: 7    ASK: We discussed the diagnosis of obesity with Tina Hartman today and Tina Hartman agreed to give [name] permission to discuss obesity behavioral modification therapy today.  ASSESS: Tina Hartman has the diagnosis of obesity and her BMI today is 41.37 Tina Hartman is in the action stage of change   ADVISE: Tina Hartman was educated on the multiple health risks of obesity as well as the benefit of weight loss to improve her health. She was advised of the need for long term treatment and the importance of lifestyle modifications to improve her current health and to decrease her risk of future health problems.  AGREE: Multiple dietary modification options and treatment options were discussed and  Tina Hartman agreed to follow the recommendations documented in the above note.  ARRANGE: Tina Hartman was educated on the importance of frequent visits to treat obesity as outlined per CMS and USPSTF guidelines and agreed to schedule her next follow up  appointment  today.  I, Burt KnackSharon Martin, am acting as transcriptionist for Quillian Quincearen Trenity Pha, MD I have reviewed the above documentation for accuracy and completeness, and I agree with the above. -Quillian Quincearen Khalia Gong, MD

## 2019-01-04 ENCOUNTER — Other Ambulatory Visit (INDEPENDENT_AMBULATORY_CARE_PROVIDER_SITE_OTHER): Payer: Self-pay | Admitting: Family Medicine

## 2019-01-04 DIAGNOSIS — E559 Vitamin D deficiency, unspecified: Secondary | ICD-10-CM

## 2019-01-09 ENCOUNTER — Ambulatory Visit: Payer: BC Managed Care – PPO | Admitting: Psychology

## 2019-01-16 ENCOUNTER — Ambulatory Visit (INDEPENDENT_AMBULATORY_CARE_PROVIDER_SITE_OTHER): Payer: BC Managed Care – PPO | Admitting: Psychology

## 2019-01-17 ENCOUNTER — Other Ambulatory Visit: Payer: Self-pay

## 2019-01-17 ENCOUNTER — Encounter (INDEPENDENT_AMBULATORY_CARE_PROVIDER_SITE_OTHER): Payer: Self-pay | Admitting: Family Medicine

## 2019-01-17 ENCOUNTER — Ambulatory Visit (INDEPENDENT_AMBULATORY_CARE_PROVIDER_SITE_OTHER): Payer: BC Managed Care – PPO | Admitting: Family Medicine

## 2019-01-17 VITALS — BP 114/74 | HR 74 | Temp 98.2°F | Ht 68.0 in | Wt 267.0 lb

## 2019-01-17 DIAGNOSIS — E559 Vitamin D deficiency, unspecified: Secondary | ICD-10-CM

## 2019-01-17 DIAGNOSIS — R7303 Prediabetes: Secondary | ICD-10-CM

## 2019-01-17 DIAGNOSIS — F3289 Other specified depressive episodes: Secondary | ICD-10-CM | POA: Diagnosis not present

## 2019-01-17 DIAGNOSIS — Z9189 Other specified personal risk factors, not elsewhere classified: Secondary | ICD-10-CM

## 2019-01-17 DIAGNOSIS — Z6841 Body Mass Index (BMI) 40.0 and over, adult: Secondary | ICD-10-CM

## 2019-01-17 MED ORDER — VICTOZA 18 MG/3ML ~~LOC~~ SOPN: 0.6000 mg | PEN_INJECTOR | SUBCUTANEOUS | 0 refills | Status: DC

## 2019-01-17 MED ORDER — METFORMIN HCL 500 MG PO TABS: 500.0000 mg | ORAL_TABLET | Freq: Every day | ORAL | 0 refills | Status: DC

## 2019-01-17 MED ORDER — VITAMIN D (ERGOCALCIFEROL) 1.25 MG (50000 UNIT) PO CAPS: 50000.0000 [IU] | ORAL_CAPSULE | ORAL | 0 refills | Status: DC

## 2019-01-18 NOTE — Progress Notes (Signed)
Office: [phone]  /  Fax: [phone]   HPI:   Chief Complaint: OBESITY Tina Hartman is here to discuss her progress with her obesity treatment plan. She is on the keep a food journal with 1500 calories and 85+ grams of protein daily and is following her eating plan approximately 60 % of the time. She states she is exercising with a trainer for 30 minutes 2 times per week, and walking 2 miles 3 times per week. Tina Hartman is doing well and she is tolerating her medications well without side effects.  Her weight is 267 lb (121.1 kg) today and has had a weight loss of 5 pounds over a period of 2 to 3 weeks since her last visit. She has lost 12 lbs since starting treatment with [name].  Pre-Diabetes Tina Hartman has a diagnosis of pre-diabetes based on her elevated Hgb A1c and was informed this puts her at greater risk of developing diabetes. She is taking Victoza and metformin, and continues to work on diet and exercise to decrease risk of diabetes.   Vitamin D Deficiency Tina Hartman has a diagnosis of vitamin D deficiency. She is currently taking prescription Vit D and denies nausea, vomiting or muscle weakness.  Depression with Emotional Eating Behaviors Tina Hartman is seeing Dr. Dewaine Conger. Korynn struggles with emotional eating and using food for comfort to the extent that it is negatively impacting her health. She often snacks when she is not hungry. Tina Hartman sometimes feels she is out of control and then feels guilty that she made poor food choices. She has been working on behavior modification techniques to help reduce her emotional eating and has been somewhat successful. She shows no sign of suicidal or homicidal ideations.  Depression screen PHQ 2/9 08/02/2018  Decreased Interest 3  Down, Depressed, Hopeless 2  PHQ - 2 Score 5  Altered sleeping 2  Tired, decreased energy 2  Change in appetite 2  Feeling bad or failure about yourself  2  Trouble concentrating 2  Moving slowly or fidgety/restless 0  Suicidal thoughts  0  PHQ-9 Score 15  Difficult doing work/chores Somewhat difficult    ASSESSMENT AND PLAN:  Prediabetes - Plan: liraglutide (VICTOZA) 18 MG/3ML SOPN, metFORMIN (GLUCOPHAGE) 500 MG tablet  Other depression,with emotional eating   Vitamin D deficiency - Plan: Vitamin D, Ergocalciferol, (DRISDOL) 1.25 MG (50000 UT) CAPS capsule  At risk for osteoporosis  Class 3 severe obesity with serious comorbidity and body mass index (BMI) of 40.0 to 44.9 in adult, unspecified obesity type (HCC)  PLAN:  Pre-Diabetes Tina Hartman will continue to work on weight loss, exercise, and decreasing simple carbohydrates in her diet to help decrease the risk of diabetes. We dicussed metformin including benefits and risks. She was informed that eating too many simple carbohydrates or too many calories at one sitting increases the likelihood of GI side effects. Catherine agrees to continue Victoza 0.6 mg SubQ q AM #1 pen and we will refill for 1 month; and she agrees to continue taking metformin 500 mg PO q AM #30 and we will refill for 1 month. Shaketta agrees to follow up with our clinic in 2 weeks as directed to monitor her progress.  Vitamin D Deficiency Tina Hartman was informed that low vitamin D levels contributes to fatigue and are associated with obesity, breast, and colon cancer. Tina Hartman agrees to continue taking prescription Vit D 50,000 IU every week #4 and we will refill for 1 month. She will follow up for routine testing of vitamin D, at least 2-3  times per year. She was informed of the risk of over-replacement of vitamin D and agrees to not increase her dose unless she discusses this with [name] first. Tina Hartman agrees to follow up with our clinic in 2 weeks.  At risk for osteopenia and osteoporosis Tina Hartman was given extended  (15 minutes) osteoporosis prevention counseling today. Caral is at risk for osteopenia and osteoporsis due to her vitamin D deficiency. She was encouraged to take her vitamin D and follow her higher calcium diet  and increase strengthening exercise to help strengthen her bones and decrease her risk of osteopenia and osteoporosis.  Depression with Emotional Eating Behaviors We discussed behavior modification techniques today to help Tina Hartman deal with her emotional eating and depression. Tina Hartman will continue to work with Dr. Dewaine Conger, and she agrees to follow up with our clinic in 2 weeks.  Obesity Tina Hartman is currently in the action stage of change. As such, her goal is to continue with weight loss efforts She has agreed to keep a food journal with 1500 calories and 85+ grams of protein daily Tina Hartman has been instructed to work up to a goal of 150 minutes of combined cardio and strengthening exercise per week or as tolerated for weight loss and overall health benefits. We discussed the following Behavioral Modification Strategies today: increasing lean protein intake, decreasing simple carbohydrates, increasing vegetables, increase H20 intake, work on meal planning and easy cooking plans, holiday eating strategies  and decrease liquid calories   Tina Hartman has agreed to follow up with our clinic in 2 weeks. She was informed of the importance of frequent follow up visits to maximize her success with intensive lifestyle modifications for her multiple health conditions.  ALLERGIES: Allergies  Allergen Reactions  . Latex     MEDICATIONS: Current Outpatient Medications on File Prior to Visit  Medication Sig Dispense Refill  . Insulin Pen Needle 32G X 4 MM MISC Use with Victoza daily 100 each 0  . OVER THE COUNTER MEDICATION Take 1 tablet by mouth daily. Sunny mood vitamin    . spironolactone (ALDACTONE) 100 MG tablet Take 100 mg by mouth daily.     No current facility-administered medications on file prior to visit.     PAST MEDICAL HISTORY: Past Medical History:  Diagnosis Date  . Acne   . Anxiety   . Back pain   . CFS (chronic fatigue syndrome)   . Constipation   . Depression   . Dyspnea   . GERD  (gastroesophageal reflux disease)   . HLD (hyperlipidemia)   . HTN (hypertension)   . Infertility, female   . Irregular menstruation   . Joint pain   . Lactose intolerance   . Lower extremity edema   . Obesity   . PCOS (polycystic ovarian syndrome)   . Sleep apnea   . Vitamin D deficiency     PAST SURGICAL HISTORY: Past Surgical History:  Procedure Laterality Date  . BLADDER SURGERY     polyp removal  . EYE SURGERY Right     SOCIAL HISTORY: Social History   Tobacco Use  . Smoking status: Never Smoker  . Smokeless tobacco: Never Used  Substance Use Topics  . Alcohol use: Yes  . Drug use: No    FAMILY HISTORY: Family History  Problem Relation Age of Onset  . Anxiety disorder Mother   . Sleep apnea Mother   . Hyperlipidemia Father     ROS: Review of Systems  Constitutional: Positive for weight loss.  Gastrointestinal: Negative for  nausea and vomiting.  Musculoskeletal:       Negative muscle weakness  Psychiatric/Behavioral: Positive for depression. Negative for suicidal ideas.    PHYSICAL EXAM: Blood pressure 114/74, pulse 74, temperature 98.2 F (36.8 C), temperature source Oral, height 5\' 8"  (1.727 m), weight 267 lb (121.1 kg), last menstrual period 01/16/2019, SpO2 100 %. Body mass index is 40.6 kg/m. Physical Exam Vitals signs reviewed.  Constitutional:      Appearance: Normal appearance. She is obese.  Cardiovascular:     Rate and Rhythm: Normal rate.     Pulses: Normal pulses.  Pulmonary:     Effort: Pulmonary effort is normal.     Breath sounds: Normal breath sounds.  Musculoskeletal: Normal range of motion.  Skin:    General: Skin is warm and dry.  Neurological:     Mental Status: She is alert and oriented to person, place, and time.  Psychiatric:        Mood and Affect: Mood normal.        Behavior: Behavior normal.     RECENT LABS AND TESTS: BMET    Component Value Date/Time   NA 139 12/08/2018 0840   K 4.4 12/08/2018 0840   CL  102 12/08/2018 0840   CO2 25 12/08/2018 0840   GLUCOSE 88 12/08/2018 0840   GLUCOSE 108 (H) 07/23/2009 1338   BUN 8 12/08/2018 0840   CREATININE 0.79 12/08/2018 0840   CALCIUM 8.8 12/08/2018 0840   GFRNONAA 93 12/08/2018 0840   GFRAA 107 12/08/2018 0840   Lab Results  Component Value Date   HGBA1C 5.7 (H) 12/08/2018   HGBA1C 5.5 08/02/2018   Lab Results  Component Value Date   INSULIN 17.2 12/08/2018   INSULIN 14.4 08/02/2018   CBC    Component Value Date/Time   WBC 4.3 08/02/2018 0000   WBC 7.0 07/23/2009 1338   WBC 6.3 12/19/2007 0013   RBC 4.15 08/02/2018 0000   RBC 3.91 07/23/2009 1338   RBC 4.40 12/19/2007 0013   HGB 12.9 08/02/2018 0000   HGB 12.6 07/23/2009 1338   HCT 37.2 08/02/2018 0000   HCT 36.9 07/23/2009 1338   PLT 284 07/23/2009 1338   MCV 90 08/02/2018 0000   MCV 94.3 07/23/2009 1338   MCH 31.1 08/02/2018 0000   MCH 32.1 07/23/2009 1338   MCHC 34.7 08/02/2018 0000   MCHC 34.1 07/23/2009 1338   MCHC 33.9 12/19/2007 0013   RDW 13.6 08/02/2018 0000   RDW 15.0 (H) 07/23/2009 1338   LYMPHSABS 1.3 08/02/2018 0000   LYMPHSABS 1.9 07/23/2009 1338   MONOABS 0.5 07/23/2009 1338   EOSABS 0.2 08/02/2018 0000   BASOSABS 0.1 08/02/2018 0000   BASOSABS 0.0 07/23/2009 1338   Iron/TIBC/Ferritin/ %Sat No results found for: IRON, TIBC, FERRITIN, IRONPCTSAT Lipid Panel     Component Value Date/Time   CHOL 155 12/08/2018 0840   TRIG 47 12/08/2018 0840   HDL 39 (L) 12/08/2018 0840   LDLCALC 106 (H) 12/08/2018 0840   Hepatic Function Panel     Component Value Date/Time   PROT 7.0 12/08/2018 0840   ALBUMIN 3.8 12/08/2018 0840   AST 10 12/08/2018 0840   ALT 9 12/08/2018 0840   ALKPHOS 77 12/08/2018 0840   BILITOT 0.2 12/08/2018 0840      Component Value Date/Time   TSH 2.700 12/08/2018 0840   TSH 5.120 (H) 08/02/2018 0000      OBESITY BEHAVIORAL INTERVENTION VISIT  Today's visit was # 8   Starting weight:  279 lbs Starting date:  08/02/2018 Today's weight : 267 lbs  Today's date: 01/17/2019 Total lbs lost to date: 12    ASK: We discussed the diagnosis of obesity with Traci Sermon today and Toshua agreed to give [name] permission to discuss obesity behavioral modification therapy today.  ASSESS: Jerilyn has the diagnosis of obesity and her BMI today is 40.61 Xiara is in the action stage of change   ADVISE: Djuna was educated on the multiple health risks of obesity as well as the benefit of weight loss to improve her health. She was advised of the need for long term treatment and the importance of lifestyle modifications to improve her current health and to decrease her risk of future health problems.  AGREE: Multiple dietary modification options and treatment options were discussed and  Tina Hartman agreed to follow the recommendations documented in the above note.  ARRANGE: Dany was educated on the importance of frequent visits to treat obesity as outlined per CMS and USPSTF guidelines and agreed to schedule her next follow up appointment today.  Wilhemena Durie, am acting as transcriptionist for Briscoe Deutscher, DO  I have reviewed the above documentation for accuracy and completeness, and I agree with the above. Briscoe Deutscher, DO

## 2019-01-23 ENCOUNTER — Encounter (INDEPENDENT_AMBULATORY_CARE_PROVIDER_SITE_OTHER): Payer: Self-pay | Admitting: Family Medicine

## 2019-02-05 ENCOUNTER — Other Ambulatory Visit (INDEPENDENT_AMBULATORY_CARE_PROVIDER_SITE_OTHER): Payer: Self-pay | Admitting: Family Medicine

## 2019-02-05 DIAGNOSIS — E559 Vitamin D deficiency, unspecified: Secondary | ICD-10-CM

## 2019-02-07 DIAGNOSIS — L2084 Intrinsic (allergic) eczema: Secondary | ICD-10-CM | POA: Diagnosis not present

## 2019-02-07 DIAGNOSIS — L700 Acne vulgaris: Secondary | ICD-10-CM | POA: Diagnosis not present

## 2019-02-09 ENCOUNTER — Ambulatory Visit (INDEPENDENT_AMBULATORY_CARE_PROVIDER_SITE_OTHER): Payer: BC Managed Care – PPO | Admitting: Family Medicine

## 2019-02-13 ENCOUNTER — Ambulatory Visit (INDEPENDENT_AMBULATORY_CARE_PROVIDER_SITE_OTHER): Payer: BC Managed Care – PPO | Admitting: Family Medicine

## 2019-02-26 ENCOUNTER — Other Ambulatory Visit (INDEPENDENT_AMBULATORY_CARE_PROVIDER_SITE_OTHER): Payer: Self-pay | Admitting: Family Medicine

## 2019-02-26 DIAGNOSIS — E559 Vitamin D deficiency, unspecified: Secondary | ICD-10-CM

## 2019-03-13 ENCOUNTER — Encounter (INDEPENDENT_AMBULATORY_CARE_PROVIDER_SITE_OTHER): Payer: Self-pay | Admitting: Family Medicine

## 2019-03-13 ENCOUNTER — Ambulatory Visit (INDEPENDENT_AMBULATORY_CARE_PROVIDER_SITE_OTHER): Payer: BC Managed Care – PPO | Admitting: Family Medicine

## 2019-03-13 ENCOUNTER — Other Ambulatory Visit: Payer: Self-pay

## 2019-03-13 VITALS — BP 107/72 | HR 102 | Temp 98.7°F | Ht 68.0 in | Wt 258.0 lb

## 2019-03-13 DIAGNOSIS — R7303 Prediabetes: Secondary | ICD-10-CM

## 2019-03-13 DIAGNOSIS — Z6841 Body Mass Index (BMI) 40.0 and over, adult: Secondary | ICD-10-CM

## 2019-03-13 DIAGNOSIS — E559 Vitamin D deficiency, unspecified: Secondary | ICD-10-CM | POA: Diagnosis not present

## 2019-03-13 MED ORDER — VITAMIN D (ERGOCALCIFEROL) 1.25 MG (50000 UNIT) PO CAPS: 50000.0000 [IU] | ORAL_CAPSULE | ORAL | 0 refills | Status: DC

## 2019-03-13 MED ORDER — INSULIN PEN NEEDLE 32G X 4 MM MISC: 0 refills | Status: DC

## 2019-03-13 MED ORDER — VICTOZA 18 MG/3ML ~~LOC~~ SOPN: 0.6000 mg | PEN_INJECTOR | SUBCUTANEOUS | 0 refills | Status: DC

## 2019-03-14 NOTE — Progress Notes (Signed)
Chief Complaint:   OBESITY Tina Hartman is here to discuss her progress with her obesity treatment plan along with follow-up of her obesity related diagnoses. Tina Hartman is on keeping a food journal and adhering to recommended goals of 1500 calories and 85+ grams of protein daily and states she is following her eating plan approximately 50% of the time. Tina Hartman states she is exercising with a person trainer for 45 minutes 2 times per week.  Today's visit was #: 9 Starting weight: 279 lbs Starting date: 08/02/2018 Today's weight: 258 lbs Today's date: 03/13/2019 Total lbs lost to date: 21 Total lbs lost since last in-office visit: 9  Interim History: Tina Hartman has done well with weight loss since her last visit before Thanksgiving. She has been mindful with her food choices. She sometimes doesn't meet her protein goals.  Subjective:   1. Pre-diabetes Tina Hartman is on Victoza and she has increased her dose to 1.2 mg, and she is tolerating it well.  2. Vitamin D deficiency Tina Hartman is stable on Vit D, but level is not yet at goal. She requests a refill today.  Assessment/Plan:   1. Pre-diabetes Tina Hartman will continue to work on weight loss, exercise, and decreasing simple carbohydrates to help decrease the risk of diabetes. We will refill Victoza and pen needles for 1 month, and we will continue to monitor.  - liraglutide (VICTOZA) 18 MG/3ML SOPN; Inject 0.1 mLs (0.6 mg total) into the skin every morning.  Dispense: 1 pen; Refill: 0 - Insulin Pen Needle 32G X 4 MM MISC; Use with Victoza daily  Dispense: 100 each; Refill: 0  2. Vitamin D deficiency Tina Hartman Vitamin D level contributes to fatigue and are associated with obesity, breast, and colon cancer. We will refill prescription Vit D for 1 month. She will follow-up for routine testing of Vitamin D, at least 2-3 times per year to avoid over-replacement. We will continue to monitor.  - Vitamin D, Ergocalciferol, (DRISDOL) 1.25 MG (50000 UNIT) CAPS capsule; Take 1  capsule (50,000 Units total) by mouth every 7 (seven) days.  Dispense: 4 capsule; Refill: 0  3. Class 3 severe obesity with serious comorbidity and body mass index (BMI) of 40.0 to 44.9 in adult, unspecified obesity type (HCC) Tina Hartman is currently in the action stage of change. As such, her goal is to continue with weight loss efforts. She has agreed to keeping a food journal and adhering to recommended goals of 1500 calories and 85+ grams of protein daily.   Exercise goals: Continue as is  Behavioral modification strategies: increasing lean protein intake and meal planning and cooking strategies.  Tina Hartman has agreed to follow-up with our clinic in 3 weeks. She was informed of the importance of frequent follow-up visits to maximize her success with intensive lifestyle modifications for her multiple health conditions.   Objective:   Blood pressure 107/72, pulse (!) 102, temperature 98.7 F (37.1 C), temperature source Oral, height 5\' 8"  (1.727 m), weight 258 lb (117 kg), last menstrual period 02/18/2019, SpO2 99 %. Body mass index is 39.23 kg/m.  General: Cooperative, alert, well developed, in no acute distress. HEENT: Conjunctivae and lids unremarkable. Cardiovascular: Regular rhythm.  Lungs: Normal work of breathing. Neurologic: No focal deficits.   Lab Results  Component Value Date   CREATININE 0.79 12/08/2018   BUN 8 12/08/2018   NA 139 12/08/2018   K 4.4 12/08/2018   CL 102 12/08/2018   CO2 25 12/08/2018   Lab Results  Component Value Date  ALT 9 12/08/2018   AST 10 12/08/2018   ALKPHOS 77 12/08/2018   BILITOT 0.2 12/08/2018   Lab Results  Component Value Date   HGBA1C 5.7 (H) 12/08/2018   HGBA1C 5.5 08/02/2018   Lab Results  Component Value Date   INSULIN 17.2 12/08/2018   INSULIN 14.4 08/02/2018   Lab Results  Component Value Date   TSH 2.700 12/08/2018   Lab Results  Component Value Date   CHOL 155 12/08/2018   HDL 39 (L) 12/08/2018   LDLCALC 106 (H)  12/08/2018   TRIG 47 12/08/2018   Lab Results  Component Value Date   WBC 4.3 08/02/2018   HGB 12.9 08/02/2018   HCT 37.2 08/02/2018   MCV 90 08/02/2018   PLT 284 07/23/2009   No results found for: IRON, TIBC, FERRITIN  Attestation Statements:   Reviewed by clinician on day of visit: allergies, medications, problem list, medical history, surgical history, family history, social history, and previous encounter notes.   I, Burt Knack, am acting as transcriptionist for Quillian Quince, MD.  I have reviewed the above documentation for accuracy and completeness, and I agree with the above. -  Quillian Quince, MD

## 2019-03-15 ENCOUNTER — Encounter (INDEPENDENT_AMBULATORY_CARE_PROVIDER_SITE_OTHER): Payer: Self-pay | Admitting: Family Medicine

## 2019-03-16 NOTE — Telephone Encounter (Signed)
Please advise,  Dx prediabetes and patient has [employer],  Should we try for PA?

## 2019-03-16 NOTE — Telephone Encounter (Signed)
Sure, lets try.

## 2019-03-28 ENCOUNTER — Encounter (INDEPENDENT_AMBULATORY_CARE_PROVIDER_SITE_OTHER): Payer: Self-pay | Admitting: Family Medicine

## 2019-03-28 NOTE — Telephone Encounter (Signed)
Victoza was denied because of dx of prediabetes

## 2019-04-04 ENCOUNTER — Encounter (INDEPENDENT_AMBULATORY_CARE_PROVIDER_SITE_OTHER): Payer: Self-pay | Admitting: Family Medicine

## 2019-04-04 NOTE — Telephone Encounter (Signed)
Please advise 

## 2019-04-04 NOTE — Telephone Encounter (Signed)
We can give her a script, ask her how she would like [name] to do this.

## 2019-04-05 ENCOUNTER — Other Ambulatory Visit (INDEPENDENT_AMBULATORY_CARE_PROVIDER_SITE_OTHER): Payer: Self-pay

## 2019-04-05 DIAGNOSIS — R7303 Prediabetes: Secondary | ICD-10-CM

## 2019-04-05 MED ORDER — VICTOZA 18 MG/3ML ~~LOC~~ SOPN: 0.6000 mg | PEN_INJECTOR | SUBCUTANEOUS | 0 refills | Status: DC

## 2019-04-10 ENCOUNTER — Ambulatory Visit (INDEPENDENT_AMBULATORY_CARE_PROVIDER_SITE_OTHER): Payer: BC Managed Care – PPO | Admitting: Family Medicine

## 2019-04-10 ENCOUNTER — Encounter (INDEPENDENT_AMBULATORY_CARE_PROVIDER_SITE_OTHER): Payer: Self-pay | Admitting: Family Medicine

## 2019-04-10 ENCOUNTER — Other Ambulatory Visit: Payer: Self-pay

## 2019-04-10 ENCOUNTER — Other Ambulatory Visit (INDEPENDENT_AMBULATORY_CARE_PROVIDER_SITE_OTHER): Payer: Self-pay | Admitting: Family Medicine

## 2019-04-10 VITALS — BP 106/67 | HR 69 | Temp 98.6°F | Ht 68.0 in | Wt 262.0 lb

## 2019-04-10 DIAGNOSIS — E559 Vitamin D deficiency, unspecified: Secondary | ICD-10-CM | POA: Diagnosis not present

## 2019-04-10 DIAGNOSIS — R7303 Prediabetes: Secondary | ICD-10-CM | POA: Diagnosis not present

## 2019-04-10 DIAGNOSIS — R5383 Other fatigue: Secondary | ICD-10-CM

## 2019-04-10 DIAGNOSIS — Z9189 Other specified personal risk factors, not elsewhere classified: Secondary | ICD-10-CM | POA: Diagnosis not present

## 2019-04-10 DIAGNOSIS — Z6841 Body Mass Index (BMI) 40.0 and over, adult: Secondary | ICD-10-CM

## 2019-04-10 MED ORDER — LIRAGLUTIDE 18 MG/3ML ~~LOC~~ SOPN: 1.2000 mg | PEN_INJECTOR | Freq: Every morning | SUBCUTANEOUS | 0 refills | Status: DC

## 2019-04-10 MED ORDER — VICTOZA 18 MG/3ML ~~LOC~~ SOPN: 1.2000 mg | PEN_INJECTOR | SUBCUTANEOUS | 0 refills | Status: DC

## 2019-04-10 NOTE — Progress Notes (Signed)
Chief Complaint:   OBESITY Tina Hartman is here to discuss her progress with her obesity treatment plan along with follow-up of her obesity related diagnoses. Tina Hartman is on the Category 3 Plan and states she is following her eating plan approximately 60% of the time. 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  Today's visit was #: 10 Starting weight: 279 lbs Starting date: 08/02/2018 Today's weight: 262 lbs Today's date: 04/10/2019 Total lbs lost to date: 17 Total lbs lost since last in-office visit: 0  Interim History: Tina Hartman has gotten off track with her Category 3 plan. She noted increased hunger, off Victoza. She states she is ready to get back on track.  Subjective:   Prediabetes Tina Hartman notes increased polyphagia, now that she is off Victoza, due to insurance denial. Tina Hartman continues to work on diet and exercise to decrease her risk of diabetes.   Lab Results  Component Value Date   HGBA1C 5.7 (H) 12/08/2018   Lab Results  Component Value Date   INSULIN 17.2 12/08/2018   INSULIN 14.4 08/02/2018   Vitamin D deficiency Tina Hartman's Vitamin D level was 30.9 on 12/08/18. She is stable on vit D. She denies nausea, vomiting or muscle weakness. Tina Hartman is due for labs.  Other fatigue Tina Hartman has ongoing fatigue. She is due to have her labs rechecked.  At risk for diabetes mellitus Tina Hartman is at higher than average risk for developing diabetes due to her obesity and prediabetes.   Assessment/Plan:   Prediabetes  Tina Hartman Hartman to re-start Victoza at 1.2 mg sub Q #2 pens with no refills (dose wrong in Epic). We will check labs and Tina Hartman will Hartman to work on weight loss, exercise, and decreasing simple carbohydrates to help decrease the risk of diabetes.   Vitamin D deficiency Low Vitamin D level contributes to fatigue and are associated with obesity, breast, and colon cancer. Tina Hartman agrees to  Hartman to take prescription Vitamin D @50 ,000 IU every week #4 with no refills and we will check labs today. She will follow-up for routine testing of Vitamin D, at least 2-3 times per year to avoid over-replacement.  Other fatigue We will check labs and follow. Tina Hartman will Hartman to work on diet and exercise.  At risk for diabetes mellitus Tina Hartman was given approximately 15 minutes of diabetes education and counseling today. We discussed intensive lifestyle modifications today with an emphasis on weight loss as well as increasing exercise and decreasing simple carbohydrates in her diet. We also reviewed medication options with an emphasis on risk versus benefit of those discussed.   Repetitive spaced learning was employed today to elicit superior memory formation and behavioral change.  Class 3 severe obesity with serious comorbidity and body mass index (BMI) of 40.0 to 44.9 in adult, unspecified obesity type (HCC) Tina Hartman is currently in the action stage of change. As such, her goal is to Hartman with weight loss efforts. She has Hartman to the Category 3 Plan or keeping a food journal and adhering to recommended goals of 1500 calories and 85+ grams of protein daily.   Exercise goals: Tina Hartman her current exercise regimen.  Behavioral modification strategies: increasing lean protein intake and decreasing simple carbohydrates.  Tina Hartman to follow-up with our clinic in 2 to 3 weeks. She was informed of the importance of frequent follow-up visits to maximize her success with intensive lifestyle modifications  for her multiple health conditions.   Tina Hartman was informed we would discuss her lab results at her next visit unless there is a critical issue that needs to be addressed sooner. Tina Hartman to keep her next visit at the Hartman upon time to discuss these results.  Objective:   Blood pressure 106/67, pulse 69, temperature 98.6 F (37 C), temperature source Oral, height 5\' 8"   (1.727 m), weight 262 lb (118.8 kg), last menstrual period 03/21/2019, SpO2 99 %. Body mass index is 39.84 kg/m.  General: Cooperative, alert, well developed, in no acute distress. HEENT: Conjunctivae and lids unremarkable. Cardiovascular: Regular rhythm.  Lungs: Normal work of breathing. Neurologic: No focal deficits.   Lab Results  Component Value Date   CREATININE 0.79 12/08/2018   BUN 8 12/08/2018   NA 139 12/08/2018   K 4.4 12/08/2018   CL 102 12/08/2018   CO2 25 12/08/2018   Lab Results  Component Value Date   ALT 9 12/08/2018   AST 10 12/08/2018   ALKPHOS 77 12/08/2018   BILITOT 0.2 12/08/2018   Lab Results  Component Value Date   HGBA1C 5.7 (H) 12/08/2018   HGBA1C 5.5 08/02/2018   Lab Results  Component Value Date   INSULIN 17.2 12/08/2018   INSULIN 14.4 08/02/2018   Lab Results  Component Value Date   TSH 2.700 12/08/2018   Lab Results  Component Value Date   CHOL 155 12/08/2018   HDL 39 (L) 12/08/2018   LDLCALC 106 (H) 12/08/2018   TRIG 47 12/08/2018   Lab Results  Component Value Date   WBC 4.3 08/02/2018   HGB 12.9 08/02/2018   HCT 37.2 08/02/2018   MCV 90 08/02/2018   PLT 284 07/23/2009   No results found for: IRON, TIBC, FERRITIN   Ref. Range 12/08/2018 08:40  Vitamin D, 25-Hydroxy Latest Ref Range: 30.0 - 100.0 ng/mL 30.9    Attestation Statements:   Reviewed by clinician on day of visit: allergies, medications, problem list, medical history, surgical history, family history, social history, and previous encounter notes.  I, 12/10/2018, am acting as transcriptionist for [place] Crane, MD.  I have reviewed the above documentation for accuracy and completeness, and I agree with the above. -  Quillian Quince, MD

## 2019-04-11 LAB — CBC WITH DIFFERENTIAL/PLATELET
Basophils Absolute: 0 10*3/uL (ref 0.0–0.2)
Basos: 1 %
EOS (ABSOLUTE): 0.1 10*3/uL (ref 0.0–0.4)
Eos: 2 %
Hematocrit: 38.8 % (ref 34.0–46.6)
Hemoglobin: 13 g/dL (ref 11.1–15.9)
Immature Grans (Abs): 0 10*3/uL (ref 0.0–0.1)
Immature Granulocytes: 0 %
Lymphocytes Absolute: 1.5 10*3/uL (ref 0.7–3.1)
Lymphs: 26 %
MCH: 31.5 pg (ref 26.6–33.0)
MCHC: 33.5 g/dL (ref 31.5–35.7)
MCV: 94 fL (ref 79–97)
Monocytes Absolute: 0.6 10*3/uL (ref 0.1–0.9)
Monocytes: 11 %
Neutrophils Absolute: 3.5 10*3/uL (ref 1.4–7.0)
Neutrophils: 60 %
Platelets: 319 10*3/uL (ref 150–450)
RBC: 4.13 x10E6/uL (ref 3.77–5.28)
RDW: 13 % (ref 11.7–15.4)
WBC: 5.7 10*3/uL (ref 3.4–10.8)

## 2019-04-11 LAB — COMPREHENSIVE METABOLIC PANEL
ALT: 16 IU/L (ref 0–32)
AST: 26 IU/L (ref 0–40)
Albumin/Globulin Ratio: 1.4 (ref 1.2–2.2)
Albumin: 4.1 g/dL (ref 3.8–4.8)
Alkaline Phosphatase: 79 IU/L (ref 39–117)
BUN/Creatinine Ratio: 11 (ref 9–23)
BUN: 9 mg/dL (ref 6–24)
Bilirubin Total: 0.3 mg/dL (ref 0.0–1.2)
CO2: 25 mmol/L (ref 20–29)
Calcium: 8.9 mg/dL (ref 8.7–10.2)
Chloride: 101 mmol/L (ref 96–106)
Creatinine, Ser: 0.81 mg/dL (ref 0.57–1.00)
GFR calc Af Amer: 104 mL/min/{1.73_m2} (ref >59)
GFR calc non Af Amer: 90 mL/min/{1.73_m2} (ref >59)
Globulin, Total: 3 g/dL (ref 1.5–4.5)
Glucose: 84 mg/dL (ref 65–99)
Potassium: 4.2 mmol/L (ref 3.5–5.2)
Sodium: 137 mmol/L (ref 134–144)
Total Protein: 7.1 g/dL (ref 6.0–8.5)

## 2019-04-11 LAB — VITAMIN B12: Vitamin B-12: 197 pg/mL — ABNORMAL LOW (ref 232–1245)

## 2019-04-11 LAB — LIPID PANEL WITH LDL/HDL RATIO
Cholesterol, Total: 161 mg/dL (ref 100–199)
HDL: 40 mg/dL (ref >39)
LDL Chol Calc (NIH): 110 mg/dL — ABNORMAL HIGH (ref 0–99)
LDL/HDL Ratio: 2.8 ratio (ref 0.0–3.2)
Triglycerides: 53 mg/dL (ref 0–149)
VLDL Cholesterol Cal: 11 mg/dL (ref 5–40)

## 2019-04-11 LAB — T4, FREE: Free T4: 0.87 ng/dL (ref 0.82–1.77)

## 2019-04-11 LAB — FOLATE: Folate: 13.2 ng/mL (ref >3.0)

## 2019-04-11 LAB — HEMOGLOBIN A1C
Est. average glucose Bld gHb Est-mCnc: 111 mg/dL
Hgb A1c MFr Bld: 5.5 % (ref 4.8–5.6)

## 2019-04-11 LAB — TSH: TSH: 2.73 u[IU]/mL (ref 0.450–4.500)

## 2019-04-11 LAB — VITAMIN D 25 HYDROXY (VIT D DEFICIENCY, FRACTURES): Vit D, 25-Hydroxy: 31.9 ng/mL (ref 30.0–100.0)

## 2019-04-11 LAB — INSULIN, RANDOM: INSULIN: 9.2 u[IU]/mL (ref 2.6–24.9)

## 2019-04-11 LAB — T3: T3, Total: 107 ng/dL (ref 71–180)

## 2019-05-01 ENCOUNTER — Encounter (INDEPENDENT_AMBULATORY_CARE_PROVIDER_SITE_OTHER): Payer: Self-pay | Admitting: Family Medicine

## 2019-05-01 ENCOUNTER — Other Ambulatory Visit: Payer: Self-pay

## 2019-05-01 ENCOUNTER — Ambulatory Visit (INDEPENDENT_AMBULATORY_CARE_PROVIDER_SITE_OTHER): Payer: BC Managed Care – PPO | Admitting: Family Medicine

## 2019-05-01 VITALS — BP 104/71 | HR 100 | Temp 98.4°F | Ht 68.0 in | Wt 259.0 lb

## 2019-05-01 DIAGNOSIS — E559 Vitamin D deficiency, unspecified: Secondary | ICD-10-CM | POA: Diagnosis not present

## 2019-05-01 DIAGNOSIS — Z9189 Other specified personal risk factors, not elsewhere classified: Secondary | ICD-10-CM

## 2019-05-01 DIAGNOSIS — R7303 Prediabetes: Secondary | ICD-10-CM | POA: Diagnosis not present

## 2019-05-01 DIAGNOSIS — Z6839 Body mass index (BMI) 39.0-39.9, adult: Secondary | ICD-10-CM

## 2019-05-01 MED ORDER — LIRAGLUTIDE 18 MG/3ML ~~LOC~~ SOPN: 1.2000 mg | PEN_INJECTOR | Freq: Every morning | SUBCUTANEOUS | 0 refills | Status: DC

## 2019-05-01 MED ORDER — VITAMIN D (ERGOCALCIFEROL) 1.25 MG (50000 UNIT) PO CAPS: 50000.0000 [IU] | ORAL_CAPSULE | ORAL | 0 refills | Status: DC

## 2019-05-01 NOTE — Progress Notes (Signed)
Chief Complaint:   OBESITY Tina Hartman is here to discuss her progress with her obesity treatment plan along with follow-up of her obesity related diagnoses. Tina Hartman is on the Category 3 Plan or keeping a food journal and adhering to recommended goals of 1500 calories and 85+ grams of protein daily and states she is following her eating plan approximately 60% of the time. Tina Hartman states she is doing weight training for 30-45 minutes 3 times per week.  Today's visit was #: 11 Starting weight: 279 lbs Starting date: 08/02/2018 Today's weight: 259 lbs Today's date: 05/01/2019 Total lbs lost to date: 20 Total lbs lost since last in-office visit: 3  Interim History: Tina Hartman continues to do well with weight loss on her Category 3 plan. Her hunger is controlled on Category 3, and she has increased exercise and feels well overall.  Subjective:   1. Pre-diabetes Tina Hartman started Victoza and notes decreased polyphagia. She sometimes has not been able to eat all of her food.  2. Vitamin D deficiency Tina Hartman is stable on Vit D, and her level is not yet at goal but is slowly improving.  3. At risk for nausea Tina Hartman is at risk for nausea due to increase in Victoza.  Assessment/Plan:   1. Pre-diabetes Tina Hartman will continue to work on weight loss, exercise, and decreasing simple carbohydrates to help decrease the risk of diabetes. We will refill Victoza for 1 month.  - liraglutide (VICTOZA) 18 MG/3ML SOPN; Inject 0.2 mLs (1.2 mg total) into the skin every morning.  Dispense: 2 pen; Refill: 0  2. Vitamin D deficiency Low Vitamin D level contributes to fatigue and are associated with obesity, breast, and colon cancer. We will refill prescription Vitamin D for 1 month. Tina Hartman will follow-up for routine testing of Vitamin D, at least 2-3 times per year to avoid over-replacement.  - Vitamin D, Ergocalciferol, (DRISDOL) 1.25 MG (50000 UNIT) CAPS capsule; Take 1 capsule (50,000 Units total) by mouth every 7 (seven)  days.  Dispense: 4 capsule; Refill: 0  3. At risk for nausea Tina Hartman was given approximately 15 minutes of nausea prevention counseling today. Tina Hartman is at risk for nausea due to her new or current medication. She was encouraged to titrate her medication slowly, make sure to stay hydrated, eat smaller portions throughout the day, and avoid high fat meals.   4. Class 2 severe obesity with serious comorbidity and body mass index (BMI) of 39.0 to 39.9 in adult, unspecified obesity type (HCC) Tina Hartman is currently in the action stage of change. As such, her goal is to continue with weight loss efforts. She has agreed to the Category 3 Plan.   Exercise goals: Tina Hartman is to continue with her current exercise regimen as is.  Behavioral modification strategies: no skipping meals.  Tina Hartman has agreed to follow-up with our clinic in 3 weeks. She was informed of the importance of frequent follow-up visits to maximize her success with intensive lifestyle modifications for her multiple health conditions.   Objective:   Blood pressure 104/71, pulse 100, temperature 98.4 F (36.9 C), temperature source Oral, height 5\' 8"  (1.727 m), weight 259 lb (117.5 kg), last menstrual period 04/17/2019, SpO2 98 %. Body mass index is 39.38 kg/m.  General: Cooperative, alert, well developed, in no acute distress. HEENT: Conjunctivae and lids unremarkable. Cardiovascular: Regular rhythm.  Lungs: Normal work of breathing. Neurologic: No focal deficits.   Lab Results  Component Value Date   CREATININE 0.81 04/10/2019   BUN 9 04/10/2019  NA 137 04/10/2019   K 4.2 04/10/2019   CL 101 04/10/2019   CO2 25 04/10/2019   Lab Results  Component Value Date   ALT 16 04/10/2019   AST 26 04/10/2019   ALKPHOS 79 04/10/2019   BILITOT 0.3 04/10/2019   Lab Results  Component Value Date   HGBA1C 5.5 04/10/2019   HGBA1C 5.7 (H) 12/08/2018   HGBA1C 5.5 08/02/2018   Lab Results  Component Value Date   INSULIN 9.2  04/10/2019   INSULIN 17.2 12/08/2018   INSULIN 14.4 08/02/2018   Lab Results  Component Value Date   TSH 2.730 04/10/2019   Lab Results  Component Value Date   CHOL 161 04/10/2019   HDL 40 04/10/2019   LDLCALC 110 (H) 04/10/2019   TRIG 53 04/10/2019   Lab Results  Component Value Date   WBC 5.7 04/10/2019   HGB 13.0 04/10/2019   HCT 38.8 04/10/2019   MCV 94 04/10/2019   PLT 319 04/10/2019   No results found for: IRON, TIBC, FERRITIN  Attestation Statements:   Reviewed by clinician on day of visit: allergies, medications, problem list, medical history, surgical history, family history, social history, and previous encounter notes.   I, Trixie Dredge, am acting as transcriptionist for Dennard Nip, MD.  I have reviewed the above documentation for accuracy and completeness, and I agree with the above. -  Dennard Nip, MD

## 2019-05-02 DIAGNOSIS — Z23 Encounter for immunization: Secondary | ICD-10-CM | POA: Diagnosis not present

## 2019-05-15 ENCOUNTER — Other Ambulatory Visit (INDEPENDENT_AMBULATORY_CARE_PROVIDER_SITE_OTHER): Payer: Self-pay | Admitting: Family Medicine

## 2019-05-15 DIAGNOSIS — E559 Vitamin D deficiency, unspecified: Secondary | ICD-10-CM

## 2019-05-23 ENCOUNTER — Ambulatory Visit (INDEPENDENT_AMBULATORY_CARE_PROVIDER_SITE_OTHER): Payer: BC Managed Care – PPO | Admitting: Family Medicine

## 2019-05-23 ENCOUNTER — Other Ambulatory Visit: Payer: Self-pay

## 2019-05-23 ENCOUNTER — Encounter (INDEPENDENT_AMBULATORY_CARE_PROVIDER_SITE_OTHER): Payer: Self-pay | Admitting: Family Medicine

## 2019-05-23 VITALS — BP 111/75 | HR 86 | Temp 98.6°F | Ht 68.0 in | Wt 262.0 lb

## 2019-05-23 DIAGNOSIS — Z6839 Body mass index (BMI) 39.0-39.9, adult: Secondary | ICD-10-CM

## 2019-05-23 DIAGNOSIS — R7303 Prediabetes: Secondary | ICD-10-CM | POA: Diagnosis not present

## 2019-05-23 DIAGNOSIS — E559 Vitamin D deficiency, unspecified: Secondary | ICD-10-CM | POA: Diagnosis not present

## 2019-05-23 MED ORDER — VITAMIN D (ERGOCALCIFEROL) 1.25 MG (50000 UNIT) PO CAPS: 50000.0000 [IU] | ORAL_CAPSULE | ORAL | 0 refills | Status: DC

## 2019-05-23 NOTE — Progress Notes (Signed)
Chief Complaint:   OBESITY Tina Hartman is here to discuss her progress with her obesity treatment plan along with follow-up of her obesity related diagnoses. Tina Hartman is on the Category 3 Plan and states she is following her eating plan approximately 65% of the time. [place] states she is exercising with a personal trainer for 30 minutes 3 times per week.  Today's visit was #: 12 Starting weight: 279 lbs Starting date: 08/02/2018 Today's weight: 262 lbs Today's date: 05/23/2019 Total lbs lost to date: 17 Total lbs lost since last in-office visit: 0  Interim History: Tina Hartman has been struggling with consuming all of the food on Category 3 meal plan. 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 She feels that her hunger is well controlled on liraglutide.  Subjective:   1. Vitamin D deficiency Tina Hartman last Vit D level was 31.9 on 04/10/2019. She is on prescription strength Vit D supplementation.  2. Pre-diabetes Tina Hartman last A1c was 5.5 on 04/10/2019. She has been experiencing intermitted nausea and vomiting when she eats a large dinner then using liraglutide. Her insurance cost is also quite high on GLP-1, and she may have to convert back to metformin.  Assessment/Plan:   1. Vitamin D deficiency Low Vitamin D level contributes to fatigue and are associated with obesity, breast, and colon cancer. We will refill prescription Vitamin D for 1 month. Tina Hartman will follow-up for routine testing of Vitamin D, at least 2-3 times per year to avoid over-replacement.  - Vitamin D, Ergocalciferol, (DRISDOL) 1.25 MG (50000 UNIT) CAPS capsule; Take 1 capsule (50,000 Units total) by mouth every 7 (seven) days.  Dispense: 4 capsule; Refill: 0  2. Pre-diabetes Tina Hartman will continue to work on weight loss, exercise, and decreasing simple carbohydrates to help decrease the risk of diabetes. Tina Hartman will continue her current GLP-1 therapy. We will recheck labs in  May 2021.  3. Class 2 severe obesity with serious comorbidity and body mass index (BMI) of 39.0 to 39.9 in adult, unspecified obesity type (HCC) Tina Hartman is currently in the action stage of change. As such, her goal is to continue with weight loss efforts. She has agreed to the Category 3 Plan.   Exercise goals: As is.  Behavioral modification strategies: no skipping meals and meal planning and cooking strategies.  Tina Hartman has agreed to follow-up with our clinic in 3 to 4 weeks. She was informed of the importance of frequent follow-up visits to maximize her success with intensive lifestyle modifications for her multiple health conditions.   Objective:   Blood pressure 111/75, pulse 86, temperature 98.6 F (37 C), temperature source Oral, height 5\' 8"  (1.727 m), weight 262 lb (118.8 kg), last menstrual period 05/17/2019, SpO2 100 %. Body mass index is 39.84 kg/m.  General: Cooperative, alert, well developed, in no acute distress. HEENT: Conjunctivae and lids unremarkable. Cardiovascular: Regular rhythm.  Lungs: Normal work of breathing. Neurologic: No focal deficits.   Lab Results  Component Value Date   CREATININE 0.81 04/10/2019   BUN 9 04/10/2019   NA 137 04/10/2019   K 4.2 04/10/2019   CL 101 04/10/2019   CO2 25 04/10/2019   Lab Results  Component Value Date   ALT 16 04/10/2019   AST 26 04/10/2019   ALKPHOS 79 04/10/2019   BILITOT 0.3 04/10/2019   Lab Results  Component Value Date   HGBA1C 5.5 04/10/2019   HGBA1C 5.7 (H) 12/08/2018   HGBA1C 5.5 08/02/2018  Lab Results  Component Value Date   INSULIN 9.2 04/10/2019   INSULIN 17.2 12/08/2018   INSULIN 14.4 08/02/2018   Lab Results  Component Value Date   TSH 2.730 04/10/2019   Lab Results  Component Value Date   CHOL 161 04/10/2019   HDL 40 04/10/2019   LDLCALC 110 (H) 04/10/2019   TRIG 53 04/10/2019   Lab Results  Component Value Date   WBC 5.7 04/10/2019   HGB 13.0 04/10/2019   HCT 38.8 04/10/2019    MCV 94 04/10/2019   PLT 319 04/10/2019   No results found for: IRON, TIBC, FERRITIN  Attestation Statements:   Reviewed by clinician on day of visit: allergies, medications, problem list, medical history, surgical history, family history, social history, and previous encounter notes.   I, Trixie Dredge, am acting as transcriptionist for Dennard Nip, MD.  I have reviewed the above documentation for accuracy and completeness, and I agree with the above. -  Dennard Nip, MD

## 2019-05-30 DIAGNOSIS — Z23 Encounter for immunization: Secondary | ICD-10-CM | POA: Diagnosis not present

## 2019-06-11 ENCOUNTER — Other Ambulatory Visit (INDEPENDENT_AMBULATORY_CARE_PROVIDER_SITE_OTHER): Payer: Self-pay | Admitting: Family Medicine

## 2019-06-11 DIAGNOSIS — E559 Vitamin D deficiency, unspecified: Secondary | ICD-10-CM

## 2019-06-19 ENCOUNTER — Ambulatory Visit (INDEPENDENT_AMBULATORY_CARE_PROVIDER_SITE_OTHER): Payer: BC Managed Care – PPO | Admitting: Family Medicine

## 2019-06-29 ENCOUNTER — Other Ambulatory Visit (INDEPENDENT_AMBULATORY_CARE_PROVIDER_SITE_OTHER): Payer: Self-pay | Admitting: Family Medicine

## 2019-06-29 DIAGNOSIS — E559 Vitamin D deficiency, unspecified: Secondary | ICD-10-CM

## 2019-08-16 ENCOUNTER — Other Ambulatory Visit (INDEPENDENT_AMBULATORY_CARE_PROVIDER_SITE_OTHER): Payer: Self-pay | Admitting: Family Medicine

## 2019-08-16 DIAGNOSIS — E559 Vitamin D deficiency, unspecified: Secondary | ICD-10-CM

## 2019-10-19 ENCOUNTER — Encounter (INDEPENDENT_AMBULATORY_CARE_PROVIDER_SITE_OTHER): Payer: Self-pay | Admitting: Family Medicine

## 2019-10-19 ENCOUNTER — Ambulatory Visit (INDEPENDENT_AMBULATORY_CARE_PROVIDER_SITE_OTHER): Payer: BC Managed Care – PPO | Admitting: Family Medicine

## 2019-10-19 ENCOUNTER — Other Ambulatory Visit: Payer: Self-pay

## 2019-10-19 VITALS — BP 121/69 | HR 88 | Temp 98.4°F | Ht 68.0 in | Wt 278.0 lb

## 2019-10-19 DIAGNOSIS — E559 Vitamin D deficiency, unspecified: Secondary | ICD-10-CM | POA: Diagnosis not present

## 2019-10-19 DIAGNOSIS — R7303 Prediabetes: Secondary | ICD-10-CM | POA: Diagnosis not present

## 2019-10-19 DIAGNOSIS — Z9189 Other specified personal risk factors, not elsewhere classified: Secondary | ICD-10-CM | POA: Diagnosis not present

## 2019-10-19 DIAGNOSIS — Z6841 Body Mass Index (BMI) 40.0 and over, adult: Secondary | ICD-10-CM

## 2019-10-19 MED ORDER — METFORMIN HCL 500 MG PO TABS: 500.0000 mg | ORAL_TABLET | Freq: Every day | ORAL | 0 refills | Status: DC

## 2019-10-19 MED ORDER — VITAMIN D (ERGOCALCIFEROL) 1.25 MG (50000 UNIT) PO CAPS: 50000.0000 [IU] | ORAL_CAPSULE | ORAL | 0 refills | Status: DC

## 2019-10-23 NOTE — Progress Notes (Signed)
Chief Complaint:   OBESITY Tina Hartman is here to discuss her progress with her obesity treatment plan along with follow-up of her obesity related diagnoses. Tina Hartman is on the Category 3 Plan and states she is following her eating plan approximately 0% of the time. Tina Hartman states she is walking 2 miles 3-4 times per week.   Today's visit was #: 13 Starting weight: 279 lbs Starting date: 08/02/2018 Today's weight: 278 lbs Today's date: 10/19/2019 Total lbs lost to date: 1 Total lbs lost since last in-office visit: 0  Interim History: Tina Hartman's last visit was almost 6 months ago. She had moved to [place] and has now moved back in town. She is ready to get back on track with her weight loss efforts.  Subjective:   1. Pre-diabetes Tina Hartman has been on Victoza in the past, but her insurance stopped covering.  2. Vitamin D deficiency Tina Hartman is off Vit D, and her level is unlikely to be at goal.  3. At risk for diabetes mellitus Tina Hartman is at higher than average risk for developing diabetes due to her obesity.   Assessment/Plan:   1. Pre-diabetes Tina Hartman will continue to work on weight loss, exercise, and decreasing simple carbohydrates to help decrease the risk of diabetes. Tina Hartman agreed to start metformin 500 mg q AM with no refills.  - metFORMIN (GLUCOPHAGE) 500 MG tablet; Take 1 tablet (500 mg total) by mouth daily with breakfast.  Dispense: 30 tablet; Refill: 0  2. Vitamin D deficiency Low Vitamin D level contributes to fatigue and are associated with obesity, breast, and colon cancer. Tina Hartman agreed to start prescription Vitamin D 50,000 IU every week with no refills. She will follow-up for routine testing of Vitamin D, at least 2-3 times per year to avoid over-replacement.  - Vitamin D, Ergocalciferol, (DRISDOL) 1.25 MG (50000 UNIT) CAPS capsule; Take 1 capsule (50,000 Units total) by mouth every 7 (seven) days.  Dispense: 4 capsule; Refill: 0  3. At risk for diabetes mellitus Tina Hartman was  given approximately 15 minutes of diabetes education and counseling today. We discussed intensive lifestyle modifications today with an emphasis on weight loss as well as increasing exercise and decreasing simple carbohydrates in her diet. We also reviewed medication options with an emphasis on risk versus benefit of those discussed.   Repetitive spaced learning was employed today to elicit superior memory formation and behavioral change.  4. Class 3 severe obesity with serious comorbidity and body mass index (BMI) of 40.0 to 44.9 in adult, unspecified obesity type (HCC) Tina Hartman is currently in the action stage of change. As such, her goal is to get back to weightloss efforts . She has agreed to keeping a food journal and adhering to recommended goals of 1500-1800 calories and 100+ grams of protein daily.   Exercise goals: As is.  Behavioral modification strategies: increasing lean protein intake and keeping a strict food journal.  Marshae has agreed to follow-up with our clinic in 2 weeks. She was informed of the importance of frequent follow-up visits to maximize her success with intensive lifestyle modifications for her multiple health conditions.   Objective:   Blood pressure 121/69, pulse 88, temperature 98.4 F (36.9 C), temperature source Oral, height 5\' 8"  (1.727 m), weight 278 lb (126.1 kg), SpO2 99 %. Body mass index is 42.27 kg/m.  General: Cooperative, alert, well developed, in no acute distress. HEENT: Conjunctivae and lids unremarkable. Cardiovascular: Regular rhythm.  Lungs: Normal work of breathing. Neurologic: No focal deficits.  Lab Results  Component Value Date   CREATININE 0.81 04/10/2019   BUN 9 04/10/2019   NA 137 04/10/2019   K 4.2 04/10/2019   CL 101 04/10/2019   CO2 25 04/10/2019   Lab Results  Component Value Date   ALT 16 04/10/2019   AST 26 04/10/2019   ALKPHOS 79 04/10/2019   BILITOT 0.3 04/10/2019   Lab Results  Component Value Date   HGBA1C 5.5  04/10/2019   HGBA1C 5.7 (H) 12/08/2018   HGBA1C 5.5 08/02/2018   Lab Results  Component Value Date   INSULIN 9.2 04/10/2019   INSULIN 17.2 12/08/2018   INSULIN 14.4 08/02/2018   Lab Results  Component Value Date   TSH 2.730 04/10/2019   Lab Results  Component Value Date   CHOL 161 04/10/2019   HDL 40 04/10/2019   LDLCALC 110 (H) 04/10/2019   TRIG 53 04/10/2019   Lab Results  Component Value Date   WBC 5.7 04/10/2019   HGB 13.0 04/10/2019   HCT 38.8 04/10/2019   MCV 94 04/10/2019   PLT 319 04/10/2019   No results found for: IRON, TIBC, FERRITIN  Attestation Statements:   Reviewed by clinician on day of visit: allergies, medications, problem list, medical history, surgical history, family history, social history, and previous encounter notes.   I, Burt Knack, am acting as transcriptionist for Quillian Quince, MD.  I have reviewed the above documentation for accuracy and completeness, and I agree with the above. -  Quillian Quince, MD

## 2019-11-08 ENCOUNTER — Other Ambulatory Visit: Payer: Self-pay

## 2019-11-08 ENCOUNTER — Ambulatory Visit (INDEPENDENT_AMBULATORY_CARE_PROVIDER_SITE_OTHER): Payer: BC Managed Care – PPO | Admitting: Family Medicine

## 2019-11-08 ENCOUNTER — Encounter (INDEPENDENT_AMBULATORY_CARE_PROVIDER_SITE_OTHER): Payer: Self-pay | Admitting: Family Medicine

## 2019-11-08 ENCOUNTER — Other Ambulatory Visit (INDEPENDENT_AMBULATORY_CARE_PROVIDER_SITE_OTHER): Payer: Self-pay | Admitting: Family Medicine

## 2019-11-08 VITALS — BP 114/71 | HR 74 | Temp 98.3°F | Ht 68.0 in | Wt 277.0 lb

## 2019-11-08 DIAGNOSIS — E559 Vitamin D deficiency, unspecified: Secondary | ICD-10-CM | POA: Diagnosis not present

## 2019-11-08 DIAGNOSIS — Z6841 Body Mass Index (BMI) 40.0 and over, adult: Secondary | ICD-10-CM | POA: Diagnosis not present

## 2019-11-08 DIAGNOSIS — R7303 Prediabetes: Secondary | ICD-10-CM

## 2019-11-08 DIAGNOSIS — Z9189 Other specified personal risk factors, not elsewhere classified: Secondary | ICD-10-CM | POA: Diagnosis not present

## 2019-11-08 MED ORDER — VITAMIN D (ERGOCALCIFEROL) 1.25 MG (50000 UNIT) PO CAPS: 50000.0000 [IU] | ORAL_CAPSULE | ORAL | 0 refills | Status: AC

## 2019-11-08 MED ORDER — METFORMIN HCL 500 MG PO TABS: 500.0000 mg | ORAL_TABLET | Freq: Two times a day (BID) | ORAL | 0 refills | Status: AC

## 2019-11-08 NOTE — Progress Notes (Signed)
Chief Complaint:   OBESITY Tina Hartman is here to discuss her progress with her obesity treatment plan along with follow-up of her obesity related diagnoses. Tina Hartman is on keeping a food journal and adhering to recommended goals of 1500-1800 calories and 100+ grams of protein daily and states she is following her eating plan approximately 75% of the time. Tina Hartman states she is walking for 30 minutes 4 times per week.  Today's visit was #: 14 Starting weight: 279 lbs Starting date: 08/02/2018 Today's weight: 277 lbs Today's date: 11/08/2019 Total lbs lost to date: 2 Total lbs lost since last in-office visit: 1  Interim History: Tina Hartman continues to do well with weight loss, but she is not meeting her protein goal and her RMR is likely starting to decrease.  Subjective:   1. Pre-diabetes Tina Hartman is still struggling with polyphagia. She is not meeting her protein goals which is likely contributing to her hunger.  2. Vitamin D deficiency Tina Hartman is stable on Vit D, but her Vit D level is not yet at goal. She requests a refill today.  3. At risk for impaired metabolic function Tina Hartman is at increased risk for impaired metabolic function due to decreased protein.  Assessment/Plan:   1. Pre-diabetes Tina Hartman will continue to work on weight loss, exercise, increase lean protein and decreasing simple carbohydrates to help decrease the risk of diabetes. Tina Hartman agreed to increase metformin to 500 mg BID with no refill.  - metFORMIN (GLUCOPHAGE) 500 MG tablet; Take 1 tablet (500 mg total) by mouth 2 (two) times daily.  Dispense: 60 tablet; Refill: 0  2. Vitamin D deficiency Low Vitamin D level contributes to fatigue and are associated with obesity, breast, and colon cancer. We will refill prescription Vitamin D for 1 month. Tina Hartman will follow-up for routine testing of Vitamin D, at least 2-3 times per year to avoid over-replacement.  - Vitamin D, Ergocalciferol, (DRISDOL) 1.25 MG (50000 UNIT) CAPS capsule;  Take 1 capsule (50,000 Units total) by mouth every 7 (seven) days.  Dispense: 4 capsule; Refill: 0  3. At risk for impaired metabolic function Tina Hartman was given approximately 15 minutes of impaired  metabolic function prevention counseling today. We discussed intensive lifestyle modifications today with an emphasis on specific nutrition and exercise instructions and strategies.   Repetitive spaced learning was employed today to elicit superior memory formation and behavioral change.  4. Class 3 severe obesity with serious comorbidity and body mass index (BMI) of 40.0 to 44.9 in adult, unspecified obesity type (HCC) Tina Hartman is currently in the action stage of change. As such, her goal is to continue with weight loss efforts. She has agreed to keeping a food journal and adhering to recommended goals of 1300-1500 calories and 90+ grams of protein daily.   Eating Out handout was given today.  Exercise goals: As is.  Behavioral modification strategies: increasing lean protein intake and better snacking choices.  Tina Hartman has agreed to follow-up with our clinic in 2 weeks. She was informed of the importance of frequent follow-up visits to maximize her success with intensive lifestyle modifications for her multiple health conditions.   Objective:   Blood pressure 114/71, pulse 74, temperature 98.3 F (36.8 C), height 5\' 8"  (1.727 m), weight 277 lb (125.6 kg), SpO2 99 %. Body mass index is 42.12 kg/m.  General: Cooperative, alert, well developed, in no acute distress. HEENT: Conjunctivae and lids unremarkable. Cardiovascular: Regular rhythm.  Lungs: Normal work of breathing. Neurologic: No focal deficits.   Lab Results  Component Value Date   CREATININE 0.81 04/10/2019   BUN 9 04/10/2019   NA 137 04/10/2019   K 4.2 04/10/2019   CL 101 04/10/2019   CO2 25 04/10/2019   Lab Results  Component Value Date   ALT 16 04/10/2019   AST 26 04/10/2019   ALKPHOS 79 04/10/2019   BILITOT 0.3  04/10/2019   Lab Results  Component Value Date   HGBA1C 5.5 04/10/2019   HGBA1C 5.7 (H) 12/08/2018   HGBA1C 5.5 08/02/2018   Lab Results  Component Value Date   INSULIN 9.2 04/10/2019   INSULIN 17.2 12/08/2018   INSULIN 14.4 08/02/2018   Lab Results  Component Value Date   TSH 2.730 04/10/2019   Lab Results  Component Value Date   CHOL 161 04/10/2019   HDL 40 04/10/2019   LDLCALC 110 (H) 04/10/2019   TRIG 53 04/10/2019   Lab Results  Component Value Date   WBC 5.7 04/10/2019   HGB 13.0 04/10/2019   HCT 38.8 04/10/2019   MCV 94 04/10/2019   PLT 319 04/10/2019   No results found for: IRON, TIBC, FERRITIN  Attestation Statements:   Reviewed by clinician on day of visit: allergies, medications, problem list, medical history, surgical history, family history, social history, and previous encounter notes.   I, Burt Knack, am acting as transcriptionist for Quillian Quince, MD.  I have reviewed the above documentation for accuracy and completeness, and I agree with the above. -  Quillian Quince, MD

## 2019-11-10 ENCOUNTER — Other Ambulatory Visit (INDEPENDENT_AMBULATORY_CARE_PROVIDER_SITE_OTHER): Payer: Self-pay | Admitting: Family Medicine

## 2019-11-10 DIAGNOSIS — R7303 Prediabetes: Secondary | ICD-10-CM

## 2019-11-21 ENCOUNTER — Other Ambulatory Visit (INDEPENDENT_AMBULATORY_CARE_PROVIDER_SITE_OTHER): Payer: Self-pay | Admitting: Family Medicine

## 2019-11-21 DIAGNOSIS — R7303 Prediabetes: Secondary | ICD-10-CM

## 2019-11-23 ENCOUNTER — Ambulatory Visit (INDEPENDENT_AMBULATORY_CARE_PROVIDER_SITE_OTHER): Payer: BC Managed Care – PPO | Admitting: Family Medicine

## 2019-12-11 DIAGNOSIS — M1711 Unilateral primary osteoarthritis, right knee: Secondary | ICD-10-CM | POA: Diagnosis not present

## 2019-12-11 DIAGNOSIS — M1712 Unilateral primary osteoarthritis, left knee: Secondary | ICD-10-CM | POA: Diagnosis not present

## 2019-12-15 ENCOUNTER — Other Ambulatory Visit (INDEPENDENT_AMBULATORY_CARE_PROVIDER_SITE_OTHER): Payer: Self-pay | Admitting: Family Medicine

## 2019-12-15 DIAGNOSIS — E559 Vitamin D deficiency, unspecified: Secondary | ICD-10-CM

## 2020-01-04 ENCOUNTER — Other Ambulatory Visit (INDEPENDENT_AMBULATORY_CARE_PROVIDER_SITE_OTHER): Payer: Self-pay | Admitting: Family Medicine

## 2020-01-04 DIAGNOSIS — R7303 Prediabetes: Secondary | ICD-10-CM

## 2020-04-25 DIAGNOSIS — L918 Other hypertrophic disorders of the skin: Secondary | ICD-10-CM | POA: Diagnosis not present

## 2020-04-25 DIAGNOSIS — L700 Acne vulgaris: Secondary | ICD-10-CM | POA: Diagnosis not present

## 2020-08-05 DIAGNOSIS — M222X1 Patellofemoral disorders, right knee: Secondary | ICD-10-CM | POA: Diagnosis not present

## 2020-08-05 DIAGNOSIS — M222X2 Patellofemoral disorders, left knee: Secondary | ICD-10-CM | POA: Diagnosis not present

## 2020-08-13 DIAGNOSIS — M25561 Pain in right knee: Secondary | ICD-10-CM | POA: Diagnosis not present

## 2020-08-15 DIAGNOSIS — M25561 Pain in right knee: Secondary | ICD-10-CM | POA: Diagnosis not present

## 2020-08-15 DIAGNOSIS — M25461 Effusion, right knee: Secondary | ICD-10-CM | POA: Diagnosis not present

## 2020-09-23 DIAGNOSIS — Z111 Encounter for screening for respiratory tuberculosis: Secondary | ICD-10-CM | POA: Diagnosis not present

## 2020-09-23 DIAGNOSIS — E282 Polycystic ovarian syndrome: Secondary | ICD-10-CM | POA: Diagnosis not present

## 2020-09-23 DIAGNOSIS — E538 Deficiency of other specified B group vitamins: Secondary | ICD-10-CM | POA: Diagnosis not present

## 2020-09-23 DIAGNOSIS — E559 Vitamin D deficiency, unspecified: Secondary | ICD-10-CM | POA: Diagnosis not present

## 2020-09-23 DIAGNOSIS — Z13220 Encounter for screening for lipoid disorders: Secondary | ICD-10-CM | POA: Diagnosis not present

## 2020-09-23 DIAGNOSIS — Z0000 Encounter for general adult medical examination without abnormal findings: Secondary | ICD-10-CM | POA: Diagnosis not present

## 2020-09-25 DIAGNOSIS — R1311 Dysphagia, oral phase: Secondary | ICD-10-CM | POA: Diagnosis not present

## 2020-09-25 DIAGNOSIS — G510 Bell's palsy: Secondary | ICD-10-CM | POA: Diagnosis not present

## 2020-09-25 DIAGNOSIS — G5133 Clonic hemifacial spasm, bilateral: Secondary | ICD-10-CM | POA: Diagnosis not present

## 2020-09-25 DIAGNOSIS — R471 Dysarthria and anarthria: Secondary | ICD-10-CM | POA: Diagnosis not present

## 2020-12-17 DIAGNOSIS — Z6841 Body Mass Index (BMI) 40.0 and over, adult: Secondary | ICD-10-CM | POA: Diagnosis not present

## 2020-12-17 DIAGNOSIS — H6982 Other specified disorders of Eustachian tube, left ear: Secondary | ICD-10-CM | POA: Diagnosis not present

## 2020-12-27 DIAGNOSIS — H9312 Tinnitus, left ear: Secondary | ICD-10-CM | POA: Diagnosis not present

## 2021-10-01 ENCOUNTER — Encounter (INDEPENDENT_AMBULATORY_CARE_PROVIDER_SITE_OTHER): Payer: Self-pay

## 2022-05-07 DIAGNOSIS — Z0289 Encounter for other administrative examinations: Secondary | ICD-10-CM

## 2022-05-21 ENCOUNTER — Encounter (INDEPENDENT_AMBULATORY_CARE_PROVIDER_SITE_OTHER): Payer: BC Managed Care – PPO | Admitting: Internal Medicine

## 2022-05-25 ENCOUNTER — Ambulatory Visit (INDEPENDENT_AMBULATORY_CARE_PROVIDER_SITE_OTHER): Payer: BC Managed Care – PPO | Admitting: Family Medicine

## 2022-06-08 ENCOUNTER — Ambulatory Visit (INDEPENDENT_AMBULATORY_CARE_PROVIDER_SITE_OTHER): Payer: BC Managed Care – PPO | Admitting: Family Medicine

## 2022-06-09 ENCOUNTER — Ambulatory Visit (INDEPENDENT_AMBULATORY_CARE_PROVIDER_SITE_OTHER): Payer: BC Managed Care – PPO | Admitting: Adult Health

## 2022-09-11 ENCOUNTER — Other Ambulatory Visit: Payer: Self-pay | Admitting: Obstetrics and Gynecology

## 2022-09-11 DIAGNOSIS — N938 Other specified abnormal uterine and vaginal bleeding: Secondary | ICD-10-CM

## 2022-09-11 DIAGNOSIS — R102 Pelvic and perineal pain: Secondary | ICD-10-CM

## 2022-09-23 ENCOUNTER — Ambulatory Visit
Admission: RE | Admit: 2022-09-23 | Discharge: 2022-09-23 | Disposition: A | Discharge status: Home / Self Care | Payer: Managed Care, Other (non HMO) | Source: Ambulatory Visit | Attending: Obstetrics and Gynecology | Admitting: Obstetrics and Gynecology

## 2022-09-23 DIAGNOSIS — R102 Pelvic and perineal pain: Secondary | ICD-10-CM

## 2022-09-23 DIAGNOSIS — N938 Other specified abnormal uterine and vaginal bleeding: Secondary | ICD-10-CM
